# Patient Record
Sex: Female | Born: 1990 | Race: Black or African American | Hispanic: No | Marital: Single | Smoking: Current some day smoker
Health system: Southern US, Community
[De-identification: ages and names within clinical notes are randomized; demographics above are authoritative.]

## PROBLEM LIST (undated history)

## (undated) DIAGNOSIS — F1410 Cocaine abuse, uncomplicated: Secondary | ICD-10-CM

## (undated) DIAGNOSIS — F1220 Cannabis dependence, uncomplicated: Secondary | ICD-10-CM

## (undated) DIAGNOSIS — F23 Brief psychotic disorder: Secondary | ICD-10-CM

## (undated) DIAGNOSIS — F419 Anxiety disorder, unspecified: Secondary | ICD-10-CM

## (undated) DIAGNOSIS — F12250 Cannabis dependence with psychotic disorder with delusions: Secondary | ICD-10-CM

## (undated) HISTORY — PX: NO PAST SURGERIES: SHX2092

## (undated) HISTORY — DX: Brief psychotic disorder: F23

---
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
## 2011-08-21 ENCOUNTER — Inpatient Hospital Stay (HOSPITAL_COMMUNITY)
Admission: AD | Admit: 2011-08-21 | Discharge: 2011-08-21 | Disposition: A | Discharge status: Home / Self Care | Payer: Medicaid Other | Source: Ambulatory Visit | Attending: Obstetrics & Gynecology | Admitting: Obstetrics & Gynecology

## 2011-08-21 ENCOUNTER — Encounter (HOSPITAL_COMMUNITY): Payer: Self-pay | Admitting: *Deleted

## 2011-08-21 DIAGNOSIS — L293 Anogenital pruritus, unspecified: Secondary | ICD-10-CM | POA: Insufficient documentation

## 2011-08-21 DIAGNOSIS — L292 Pruritus vulvae: Secondary | ICD-10-CM

## 2011-08-21 DIAGNOSIS — R42 Dizziness and giddiness: Secondary | ICD-10-CM | POA: Insufficient documentation

## 2011-08-21 DIAGNOSIS — N912 Amenorrhea, unspecified: Secondary | ICD-10-CM

## 2011-08-21 DIAGNOSIS — N911 Secondary amenorrhea: Secondary | ICD-10-CM

## 2011-08-21 LAB — WET PREP, GENITAL
Clue Cells Wet Prep HPF POC: NONE SEEN
Yeast Wet Prep HPF POC: NONE SEEN

## 2011-08-21 MED ORDER — NYSTATIN-TRIAMCINOLONE 100000-0.1 UNIT/GM-% EX OINT: TOPICAL_OINTMENT | Freq: Two times a day (BID) | CUTANEOUS | Status: DC

## 2011-08-21 NOTE — MAU Note (Signed)
C/o feeling fainty and ? pregnancy

## 2011-08-21 NOTE — MAU Provider Note (Signed)
History     CSN: [id number]  Arrival date and time: 08/21/11 1478   First Provider Initiated Contact with Patient 08/21/11 1005      Chief Complaint  Patient presents with  . Possible Pregnancy   HPI Pt presents today after having felt dizzy and faint in the middle of the night on Saturday. She states she that she drank some water and felt better but did feel like her fallopian tubes were hurting. She then realized she had not had her period since sometime in June and thought she was getting it. Since she has not started in the last two days she thought she should get checked out. She also reports milky, white vaginal discharge and itching.    Past Medical History  Diagnosis Date  . No pertinent past medical history     Past Surgical History  Procedure Date  . No past surgeries     Family History  Problem Relation Age of Onset  . Other Neg Hx     History  Substance Use Topics  . Smoking status: Current Everyday Smoker -- 0.2 packs/day for 4 years    Types: Cigarettes  . Smokeless tobacco: Not on file  . Alcohol Use: Yes     occasional    Allergies: No Known Allergies  No prescriptions prior to admission    Review of Systems  Constitutional: Negative for fever and chills.  Gastrointestinal: Positive for diarrhea. Negative for nausea, vomiting and abdominal pain.  Genitourinary: Negative for dysuria, urgency, frequency and hematuria.   Physical Exam   Blood pressure 114/63, pulse 76, temperature 98.1 F (36.7 C), temperature source Oral, resp. rate 16, height 5\' 4"  (1.626 m), weight 70.308 kg (155 lb), last menstrual period 06/23/2011.  Physical Exam  Constitutional: She appears well-developed and well-nourished. No distress.  Cardiovascular: Normal rate, regular rhythm and normal heart sounds.   Respiratory: Effort normal and breath sounds normal.  GI: Soft. Bowel sounds are normal. She exhibits no distension and no mass. There is no tenderness. There is no  rebound and no guarding.  Pelvic: nefg, vagina no lesions, slight thick, white odorless discharge in the vaginal vault, no blood, cervix non friable, no cervical motion tenderness  Results for orders placed during the hospital encounter of 08/21/11 (from the past 24 hour(s))  POCT PREGNANCY, URINE     Status: Normal   Collection Time   08/21/11  9:51 AM      Component Value Range   Preg Test, Ur NEGATIVE  NEGATIVE  WET PREP, GENITAL     Status: Abnormal   Collection Time   08/21/11 11:20 AM      Component Value Range   Yeast Wet Prep HPF POC NONE SEEN  NONE SEEN   Trich, Wet Prep NONE SEEN  NONE SEEN   Clue Cells Wet Prep HPF POC NONE SEEN  NONE SEEN   WBC, Wet Prep HPF POC FEW (*) NONE SEEN  HCG, QUANTITATIVE, PREGNANCY     Status: Normal   Collection Time   08/21/11 11:30 AM      Component Value Range   hCG, Beta Chain, Quant, S <1  <5 mIU/mL   MAU Course  Procedures   Assessment and Plan  1. Missed period DDX include: pregnancy, etopic pregnancy, STI  Will do: Urine pregnancy, wet prep, C&S culture  Plan:  FORTH, VICTORIA 08/21/2011, 10:20 AM   Assessment: 1. Secondary amenorrhea w/ neg quant  2. Vulvar itching    Plan: D/C home  Medication List  As of 08/21/2011  4:29 PM   START taking these medications         nystatin-triamcinolone ointment   Commonly known as: MYCOLOG   Apply topically 2 (two) times daily.          Where to get your medications    These are the prescriptions that you need to pick up.   You may get these medications from any pharmacy.         nystatin-triamcinolone ointment           Follow-up Information    Follow up with Gynecologist. (As needed if symptoms worsen or if no period  in 1 month)         I was present for the exam and agree with above.  [place], [place] 08/21/2011 4:29 PM

## 2011-08-22 LAB — GC/CHLAMYDIA PROBE AMP, GENITAL: GC Probe Amp, Genital: NEGATIVE

## 2011-08-22 MED ORDER — NYSTATIN-TRIAMCINOLONE 100000-0.1 UNIT/GM-% EX OINT: TOPICAL_OINTMENT | Freq: Two times a day (BID) | CUTANEOUS | Status: AC

## 2013-01-02 ENCOUNTER — Other Ambulatory Visit: Payer: Self-pay | Admitting: Obstetrics & Gynecology

## 2013-01-02 ENCOUNTER — Other Ambulatory Visit (HOSPITAL_COMMUNITY)
Admission: RE | Admit: 2013-01-02 | Discharge: 2013-01-02 | Disposition: A | Discharge status: Home / Self Care | Payer: BC Managed Care – PPO | Source: Ambulatory Visit | Attending: Obstetrics & Gynecology | Admitting: Obstetrics & Gynecology

## 2013-01-02 DIAGNOSIS — Z01419 Encounter for gynecological examination (general) (routine) without abnormal findings: Secondary | ICD-10-CM | POA: Insufficient documentation

## 2013-01-02 DIAGNOSIS — N760 Acute vaginitis: Secondary | ICD-10-CM | POA: Insufficient documentation

## 2013-01-02 DIAGNOSIS — Z113 Encounter for screening for infections with a predominantly sexual mode of transmission: Secondary | ICD-10-CM | POA: Insufficient documentation

## 2013-11-17 ENCOUNTER — Encounter (HOSPITAL_COMMUNITY): Payer: Self-pay | Admitting: *Deleted

## 2015-02-17 DIAGNOSIS — F12250 Cannabis dependence with psychotic disorder with delusions: Secondary | ICD-10-CM

## 2015-02-17 HISTORY — DX: Cannabis dependence with psychotic disorder with delusions: F12.250

## 2015-03-08 ENCOUNTER — Encounter (HOSPITAL_COMMUNITY): Payer: Self-pay | Admitting: Emergency Medicine

## 2015-03-08 ENCOUNTER — Emergency Department (HOSPITAL_COMMUNITY): Payer: BLUE CROSS/BLUE SHIELD

## 2015-03-08 ENCOUNTER — Emergency Department (HOSPITAL_COMMUNITY)
Admission: EM | Admit: 2015-03-08 | Discharge: 2015-03-09 | Disposition: A | Discharge status: Other Acute Inpatient Hospital | Payer: BLUE CROSS/BLUE SHIELD | Attending: Emergency Medicine | Admitting: Emergency Medicine

## 2015-03-08 DIAGNOSIS — Z008 Encounter for other general examination: Secondary | ICD-10-CM | POA: Diagnosis present

## 2015-03-08 DIAGNOSIS — F17210 Nicotine dependence, cigarettes, uncomplicated: Secondary | ICD-10-CM | POA: Insufficient documentation

## 2015-03-08 DIAGNOSIS — F1210 Cannabis abuse, uncomplicated: Secondary | ICD-10-CM | POA: Diagnosis not present

## 2015-03-08 DIAGNOSIS — F12159 Cannabis abuse with psychotic disorder, unspecified: Secondary | ICD-10-CM | POA: Diagnosis not present

## 2015-03-08 DIAGNOSIS — F919 Conduct disorder, unspecified: Secondary | ICD-10-CM | POA: Diagnosis not present

## 2015-03-08 DIAGNOSIS — R4689 Other symptoms and signs involving appearance and behavior: Secondary | ICD-10-CM

## 2015-03-08 LAB — CBC
HEMATOCRIT: 30.8 % — AB (ref 36.0–46.0)
HEMOGLOBIN: 10.2 g/dL — AB (ref 12.0–15.0)
MCH: 29.4 pg (ref 26.0–34.0)
MCHC: 33.1 g/dL (ref 30.0–36.0)
MCV: 88.8 fL (ref 78.0–100.0)
PLATELETS: 249 10*3/uL (ref 150–400)
RBC: 3.47 MIL/uL — ABNORMAL LOW (ref 3.87–5.11)
RDW: 12.8 % (ref 11.5–15.5)
WBC: 5.3 10*3/uL (ref 4.0–10.5)

## 2015-03-08 LAB — COMPREHENSIVE METABOLIC PANEL
ALBUMIN: 4.9 g/dL (ref 3.5–5.0)
ALK PHOS: 49 U/L (ref 38–126)
ALT: 15 U/L (ref 14–54)
AST: 14 U/L — AB (ref 15–41)
Anion gap: 10 (ref 5–15)
BILIRUBIN TOTAL: 0.3 mg/dL (ref 0.3–1.2)
BUN: 13 mg/dL (ref 6–20)
CALCIUM: 9.1 mg/dL (ref 8.9–10.3)
CO2: 24 mmol/L (ref 22–32)
Chloride: 106 mmol/L (ref 101–111)
Creatinine, Ser: 0.88 mg/dL (ref 0.44–1.00)
GFR calc Af Amer: >60 mL/min (ref >60)
GFR calc non Af Amer: >60 mL/min (ref >60)
GLUCOSE: 111 mg/dL — AB (ref 65–99)
Potassium: 3.1 mmol/L — ABNORMAL LOW (ref 3.5–5.1)
Sodium: 140 mmol/L (ref 135–145)
TOTAL PROTEIN: 7.9 g/dL (ref 6.5–8.1)

## 2015-03-08 LAB — RAPID URINE DRUG SCREEN, HOSP PERFORMED
AMPHETAMINES: NOT DETECTED
BENZODIAZEPINES: NOT DETECTED
Barbiturates: NOT DETECTED
COCAINE: NOT DETECTED
Opiates: NOT DETECTED
Tetrahydrocannabinol: POSITIVE — AB

## 2015-03-08 LAB — SALICYLATE LEVEL: Salicylate Lvl: <4 mg/dL (ref 2.8–30.0)

## 2015-03-08 LAB — ETHANOL: Alcohol, Ethyl (B): <5 mg/dL (ref <5)

## 2015-03-08 LAB — ACETAMINOPHEN LEVEL: Acetaminophen (Tylenol), Serum: <10 ug/mL — ABNORMAL LOW (ref 10–30)

## 2015-03-08 MED ORDER — HALOPERIDOL LACTATE 5 MG/ML IJ SOLN
5.0000 mg | Freq: Once | INTRAMUSCULAR | Status: AC
  Administered 2015-03-08: 5 mg via INTRAMUSCULAR
  Filled 2015-03-08: qty 1

## 2015-03-08 MED ORDER — LORAZEPAM 1 MG PO TABS
1.0000 mg | ORAL_TABLET | Freq: Three times a day (TID) | ORAL | Status: DC | PRN
  Filled 2015-03-08: qty 1

## 2015-03-08 MED ORDER — DIPHENHYDRAMINE HCL 50 MG/ML IJ SOLN
50.0000 mg | Freq: Once | INTRAMUSCULAR | Status: AC
  Administered 2015-03-08: 50 mg via INTRAMUSCULAR
  Filled 2015-03-08: qty 1

## 2015-03-08 MED ORDER — NICOTINE 21 MG/24HR TD PT24: 21.0000 mg | MEDICATED_PATCH | Freq: Every day | TRANSDERMAL | Status: DC

## 2015-03-08 MED ORDER — ZOLPIDEM TARTRATE 5 MG PO TABS
5.0000 mg | ORAL_TABLET | Freq: Every evening | ORAL | Status: DC | PRN
  Filled 2015-03-08: qty 1

## 2015-03-08 MED ORDER — ONDANSETRON HCL 4 MG PO TABS
4.0000 mg | ORAL_TABLET | Freq: Three times a day (TID) | ORAL | Status: DC | PRN
Start: 2015-03-08 — End: 2015-03-09

## 2015-03-08 MED ORDER — POTASSIUM CHLORIDE CRYS ER 10 MEQ PO TBCR
10.0000 meq | EXTENDED_RELEASE_TABLET | Freq: Every day | ORAL | Status: DC
  Filled 2015-03-08: qty 1

## 2015-03-08 MED ORDER — POTASSIUM CHLORIDE CRYS ER 20 MEQ PO TBCR
40.0000 meq | EXTENDED_RELEASE_TABLET | Freq: Once | ORAL | Status: AC
  Administered 2015-03-08: 40 meq via ORAL
  Filled 2015-03-08: qty 2

## 2015-03-08 MED ORDER — ACETAMINOPHEN 325 MG PO TABS
650.0000 mg | ORAL_TABLET | ORAL | Status: DC | PRN
  Filled 2015-03-08: qty 2

## 2015-03-08 MED ORDER — IBUPROFEN 200 MG PO TABS: 600.0000 mg | ORAL_TABLET | Freq: Three times a day (TID) | ORAL | Status: DC | PRN

## 2015-03-08 MED ORDER — LORAZEPAM 2 MG/ML IJ SOLN
2.0000 mg | Freq: Once | INTRAMUSCULAR | Status: AC
  Administered 2015-03-08: 2 mg via INTRAMUSCULAR
  Filled 2015-03-08: qty 1

## 2015-03-08 MED ORDER — ALUM & MAG HYDROXIDE-SIMETH 200-200-20 MG/5ML PO SUSP: 30.0000 mL | ORAL | Status: DC | PRN

## 2015-03-08 NOTE — ED Notes (Signed)
Pt would not sign a ROI for [place] to discuss her care or disposition with her mother. She told her mother that it is time to go, so her mother left.
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
## 2015-03-08 NOTE — ED Notes (Signed)
Bed: [facility]37 Expected date:  Expected time:  Means of arrival:  Comments: Triage 9 
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
## 2015-03-08 NOTE — ED Notes (Signed)
Patient pacing, restless, appears suspicious. Lies down briefly, then back up.   Q 15 safety checks continue.

## 2015-03-08 NOTE — ED Notes (Addendum)
Per [employer] pt sent voluntarily per family request related to pt sitting in bedroom with knife because she believes mother is speaking with devil and trying to hurt her. Upon assessment pt states to staff "you are doing some evil and God is not happy with you." Pt denies SI/HI or A/VH.

## 2015-03-08 NOTE — BH Assessment (Signed)
[facility] Assessment Progress Note  The following facilities have been contacted to seek placement for this pt, with results as noted:  Beds available, information sent, decision pending:  [facility] [place] [facility]   At capacity:  [facility] [place]   Doylene Canning, [place] Triage Specialist [phone]

## 2015-03-08 NOTE — ED Notes (Signed)
Patient appears pleasant, cooperative. Mildly confused. Disoriented to situation. Childlike speech, behavior.  Patient appears confused and unsure when asked about SI, HI, AVH.   Oriented patient to place and situation. Encouragement offered.  Q 15 safety checks continue.

## 2015-03-08 NOTE — ED Notes (Signed)
Loudly rebuking satan and demons from patients and staff on the unit. Stating that she wants to leave.  Encouraged to return to her room and try to rest. Reminded patient that she is under IVC and must see psychiatry.  Q 15 safety checks continue.

## 2015-03-08 NOTE — ED Notes (Signed)
Restless, tearful, confused. Wants to leave.  Reoriented to situation. Encouragement offered. Environment adjusted. Medication offered, patient paranoid, declined.   Q 15 safety checks continue.

## 2015-03-08 NOTE — ED Notes (Signed)
Patient medicated. Observed to have bodysuite/bra on. Patient refuses to remove item(s).

## 2015-03-08 NOTE — ED Notes (Addendum)
PT REFUSE TO HAVE BLOOD COLLECTED

## 2015-03-08 NOTE — ED Notes (Signed)
Pt is A&O x3. She does not have insight as to why she is here. She admits to having thoughts of harming herself sometimes and is not sure if she wants to harm others. Her behavior is appropriate. She was oriented to the unit and given juice to drink. She did not want any other snack.

## 2015-03-09 ENCOUNTER — Encounter (HOSPITAL_COMMUNITY): Payer: Self-pay

## 2015-03-09 ENCOUNTER — Inpatient Hospital Stay (HOSPITAL_COMMUNITY)
Admission: AD | Admit: 2015-03-09 | Discharge: 2015-03-12 | DRG: 897 | Disposition: A | Discharge status: Home / Self Care | Payer: BLUE CROSS/BLUE SHIELD | Attending: Psychiatry | Admitting: Psychiatry

## 2015-03-09 DIAGNOSIS — F17210 Nicotine dependence, cigarettes, uncomplicated: Secondary | ICD-10-CM | POA: Diagnosis present

## 2015-03-09 DIAGNOSIS — F12159 Cannabis abuse with psychotic disorder, unspecified: Secondary | ICD-10-CM | POA: Diagnosis not present

## 2015-03-09 DIAGNOSIS — F1410 Cocaine abuse, uncomplicated: Secondary | ICD-10-CM | POA: Diagnosis present

## 2015-03-09 DIAGNOSIS — F12250 Cannabis dependence with psychotic disorder with delusions: Secondary | ICD-10-CM | POA: Diagnosis present

## 2015-03-09 DIAGNOSIS — F1220 Cannabis dependence, uncomplicated: Secondary | ICD-10-CM | POA: Clinically undetermined

## 2015-03-09 DIAGNOSIS — F419 Anxiety disorder, unspecified: Secondary | ICD-10-CM | POA: Diagnosis present

## 2015-03-09 DIAGNOSIS — F29 Unspecified psychosis not due to a substance or known physiological condition: Secondary | ICD-10-CM | POA: Diagnosis present

## 2015-03-09 DIAGNOSIS — F24 Shared psychotic disorder: Secondary | ICD-10-CM | POA: Diagnosis present

## 2015-03-09 MED ORDER — HALOPERIDOL 1 MG PO TABS
1.0000 mg | ORAL_TABLET | Freq: Two times a day (BID) | ORAL | Status: DC
  Administered 2015-03-09: 1 mg via ORAL
  Filled 2015-03-09 (×3): qty 1
  Filled 2015-03-09: qty 2
  Filled 2015-03-09: qty 1
  Filled 2015-03-09: qty 2

## 2015-03-09 MED ORDER — BENZTROPINE MESYLATE 1 MG PO TABS
0.5000 mg | ORAL_TABLET | Freq: Two times a day (BID) | ORAL | Status: DC
  Filled 2015-03-09: qty 1

## 2015-03-09 MED ORDER — BENZTROPINE MESYLATE 0.5 MG PO TABS
0.5000 mg | ORAL_TABLET | Freq: Two times a day (BID) | ORAL | Status: DC
  Administered 2015-03-09: 0.5 mg via ORAL
  Filled 2015-03-09 (×6): qty 1

## 2015-03-09 MED ORDER — TRAZODONE HCL 100 MG PO TABS: 100.0000 mg | ORAL_TABLET | Freq: Every evening | ORAL | Status: DC | PRN

## 2015-03-09 MED ORDER — MAGNESIUM HYDROXIDE 400 MG/5ML PO SUSP: 30.0000 mL | Freq: Every day | ORAL | Status: DC | PRN

## 2015-03-09 MED ORDER — ALUM & MAG HYDROXIDE-SIMETH 200-200-20 MG/5ML PO SUSP: 30.0000 mL | ORAL | Status: DC | PRN

## 2015-03-09 MED ORDER — ACETAMINOPHEN 325 MG PO TABS: 650.0000 mg | ORAL_TABLET | Freq: Four times a day (QID) | ORAL | Status: DC | PRN

## 2015-03-09 MED ORDER — ONDANSETRON HCL 4 MG PO TABS: 4.0000 mg | ORAL_TABLET | Freq: Three times a day (TID) | ORAL | Status: DC | PRN

## 2015-03-09 MED ORDER — NICOTINE 21 MG/24HR TD PT24
21.0000 mg | MEDICATED_PATCH | Freq: Every day | TRANSDERMAL | Status: DC
  Filled 2015-03-09 (×6): qty 1

## 2015-03-09 MED ORDER — POTASSIUM CHLORIDE CRYS ER 10 MEQ PO TBCR
10.0000 meq | EXTENDED_RELEASE_TABLET | Freq: Every day | ORAL | Status: DC
  Administered 2015-03-10: 10 meq via ORAL
  Filled 2015-03-09 (×3): qty 1

## 2015-03-09 MED ORDER — IBUPROFEN 600 MG PO TABS: 600.0000 mg | ORAL_TABLET | Freq: Three times a day (TID) | ORAL | Status: DC | PRN

## 2015-03-09 MED ORDER — ACETAMINOPHEN 325 MG PO TABS: 650.0000 mg | ORAL_TABLET | ORAL | Status: DC | PRN

## 2015-03-09 MED ORDER — HALOPERIDOL 1 MG PO TABS
1.0000 mg | ORAL_TABLET | Freq: Two times a day (BID) | ORAL | Status: DC
  Filled 2015-03-09: qty 1

## 2015-03-09 MED ORDER — TRAZODONE HCL 100 MG PO TABS
100.0000 mg | ORAL_TABLET | Freq: Every evening | ORAL | Status: DC | PRN
Start: 2015-03-09 — End: 2015-03-12
  Administered 2015-03-09 – 2015-03-11 (×2): 100 mg via ORAL
  Filled 2015-03-09 (×2): qty 1

## 2015-03-09 MED ORDER — LORAZEPAM 1 MG PO TABS: 1.0000 mg | ORAL_TABLET | Freq: Three times a day (TID) | ORAL | Status: DC | PRN

## 2015-03-09 NOTE — Plan of Care (Signed)
Problem: Alteration in mood & ability to function due to Goal: STG-Patient will attend groups Outcome: Progressing Pt attended evening wrap up group     

## 2015-03-09 NOTE — ED Notes (Signed)
Pt discharged ambulatory with [employer]  All belongings were returned to patient.

## 2015-03-09 NOTE — Consult Note (Signed)
[facility] Consult   Reason for Consult:Substance induced mood disorder, R/O Psychosis.   Referring Physician:  EDP Patient Identification: Brenda Hamilton MRN:  [id number] Principal Diagnosis: Cannabis abuse with psychotic disorder [facility]) Diagnosis:   Patient Active Problem List   Diagnosis Date Noted  . Cannabis abuse with psychotic disorder [facility]) [F12.159] 03/09/2015    Priority: High    Total Time spent with patient: 45 minutes  Subjective:   Brenda Hamilton is a 25 y.o. female patient admitted with  Substance induced mood disorder, R/O Psychosis.  HPI:  AA Female, 25 years old was evaluated this morning after she was brought in under IVC  For Psychotic symptoms.  Patient was found sitting in her bedroom  With a knife in her hand because she believed that her mother was speaking with the devils.  Patient, this morning remembers that she was crying and screaming because she thought her boyfriend is sleeping with her roommate.  She admits to daily use of Marijuana and states that she is always keeping a knife all the time her for protection.  She was agitated on arrival to the ER and was yelling at staff members.  Patient denies previous outpatient or inpatient hospitalization or diagnosis.  She denies taking any MH medications.  She denies SI/HI/AVH and she has been accepted for admission and we will be seeking placement at any facility with available bed.  Past Psychiatric History:  none  Risk to Self: Suicidal Ideation: No Suicidal Intent: No Is patient at risk for suicide?: No Suicidal Plan?: No Access to Means: No What has been your use of drugs/alcohol within the last 12 months?: see SA section Intentional Self Injurious Behavior: None Risk to Others: Homicidal Ideation: No Thoughts of Harm to Others: No (had knife today "defending herself") Current Homicidal Intent: No Current Homicidal Plan: No Access to Homicidal Means: No History of harm to others?:  No Assessment of Violence: On admission (uncooperative to come to the hospital) Violent Behavior Description:  (had knife, but did not get aggressive) Does patient have access to weapons?: Yes (Comment) (knife) Criminal Charges Pending?: No Does patient have a court date: No Prior Inpatient Therapy: Prior Inpatient Therapy: No Prior Outpatient Therapy: Prior Outpatient Therapy: No Does patient have an ACCT team?: No Does patient have Intensive In-House Services?  : No Does patient have [facility] services? : No Does patient have P4CC services?: No  Past Medical History:  Past Medical History  Diagnosis Date  . No pertinent past medical history     Past Surgical History  Procedure Laterality Date  . No past surgeries     Family History:  Family History  Problem Relation Age of Onset  . Other Neg Hx    Family Psychiatric  History:  Unknown, patient denies Social History:  History  Alcohol Use  . Yes    Comment: occasional     History  Drug Use  . Yes  . Special: Marijuana, MDMA (Ecstacy), Cocaine    Comment: Marijuana one week ago    Social History   Social History  . Marital Status: Single    Spouse Name: N/A  . Number of Children: N/A  . Years of Education: N/A   Social History Main Topics  . Smoking status: Current Every Day Smoker -- 0.25 packs/day for 4 years    Types: Cigarettes  . Smokeless tobacco: None  . Alcohol Use: Yes     Comment: occasional  . Drug Use: Yes  Special: Marijuana, MDMA (Ecstacy), Cocaine     Comment: Marijuana one week ago  . Sexual Activity: Yes    Birth Control/ Protection: Condom   Other Topics Concern  . None   Social History Narrative   Additional Social History:    Allergies:  No Known Allergies  Labs:  Results for orders placed or performed during the hospital encounter of 03/08/15 (from the past 48 hour(s))  Urine rapid drug screen (hosp performed) (Not at [facility])     Status: Abnormal   Collection Time: 03/08/15  12:42 PM  Result Value Ref Range   Opiates NONE DETECTED NONE DETECTED   Cocaine NONE DETECTED NONE DETECTED   Benzodiazepines NONE DETECTED NONE DETECTED   Amphetamines NONE DETECTED NONE DETECTED   Tetrahydrocannabinol POSITIVE (A) NONE DETECTED   Barbiturates NONE DETECTED NONE DETECTED    Comment:        DRUG SCREEN FOR MEDICAL PURPOSES ONLY.  IF CONFIRMATION IS NEEDED FOR ANY PURPOSE, NOTIFY LAB WITHIN 5 DAYS.        LOWEST DETECTABLE LIMITS FOR URINE DRUG SCREEN Drug Class       Cutoff (ng/mL) Amphetamine      1000 Barbiturate      200 Benzodiazepine   400 Tricyclics       867 Opiates          300 Cocaine          300 THC              50   Ethanol (ETOH)     Status: None   Collection Time: 03/08/15 12:50 PM  Result Value Ref Range   Alcohol, Ethyl (B) <5 <5 mg/dL    Comment:        LOWEST DETECTABLE LIMIT FOR SERUM ALCOHOL IS 5 mg/dL FOR MEDICAL PURPOSES ONLY   Salicylate level     Status: None   Collection Time: 03/08/15 12:50 PM  Result Value Ref Range   Salicylate Lvl <6.1 2.8 - 30.0 mg/dL  Acetaminophen level     Status: Abnormal   Collection Time: 03/08/15 12:50 PM  Result Value Ref Range   Acetaminophen (Tylenol), Serum <10 (L) 10 - 30 ug/mL    Comment:        THERAPEUTIC CONCENTRATIONS VARY SIGNIFICANTLY. A RANGE OF 10-30 ug/mL MAY BE AN EFFECTIVE CONCENTRATION FOR MANY PATIENTS. HOWEVER, SOME ARE BEST TREATED AT CONCENTRATIONS OUTSIDE THIS RANGE. ACETAMINOPHEN CONCENTRATIONS >150 ug/mL AT 4 HOURS AFTER INGESTION AND >50 ug/mL AT 12 HOURS AFTER INGESTION ARE OFTEN ASSOCIATED WITH TOXIC REACTIONS.   Comprehensive metabolic panel     Status: Abnormal   Collection Time: 03/08/15 12:53 PM  Result Value Ref Range   Sodium 140 135 - 145 mmol/L   Potassium 3.1 (L) 3.5 - 5.1 mmol/L   Chloride 106 101 - 111 mmol/L   CO2 24 22 - 32 mmol/L   Glucose, Bld 111 (H) 65 - 99 mg/dL   BUN 13 6 - 20 mg/dL   Creatinine, Ser 0.88 0.44 - 1.00 mg/dL   Calcium  9.1 8.9 - 10.3 mg/dL   Total Protein 7.9 6.5 - 8.1 g/dL   Albumin 4.9 3.5 - 5.0 g/dL   AST 14 (L) 15 - 41 U/L   ALT 15 14 - 54 U/L   Alkaline Phosphatase 49 38 - 126 U/L   Total Bilirubin 0.3 0.3 - 1.2 mg/dL   GFR calc non Af Amer >60 >60 mL/min   GFR calc Af Amer >60 >60  mL/min    Comment: (NOTE) The eGFR has been calculated using the CKD EPI equation. This calculation has not been validated in all clinical situations. eGFR's persistently <60 mL/min signify possible Chronic Kidney Disease.    Anion gap 10 5 - 15  CBC     Status: Abnormal   Collection Time: 03/08/15 12:53 PM  Result Value Ref Range   WBC 5.3 4.0 - 10.5 K/uL   RBC 3.47 (L) 3.87 - 5.11 MIL/uL   Hemoglobin 10.2 (L) 12.0 - 15.0 g/dL   HCT 30.8 (L) 36.0 - 46.0 %   MCV 88.8 78.0 - 100.0 fL   MCH 29.4 26.0 - 34.0 pg   MCHC 33.1 30.0 - 36.0 g/dL   RDW 12.8 11.5 - 15.5 %   Platelets 249 150 - 400 K/uL    Current Facility-Administered Medications  Medication Dose Route Frequency Provider Last Rate Last Dose  . acetaminophen (TYLENOL) tablet 650 mg  650 mg Oral Q4H PRN Shawn C Joy, PA-C      . alum & mag hydroxide-simeth (MAALOX/MYLANTA) 200-200-20 MG/5ML suspension 30 mL  30 mL Oral PRN Shawn C Joy, PA-C      . benztropine (COGENTIN) tablet 0.5 mg  0.5 mg Oral BID Shenee Wignall, MD   0.5 mg at 03/09/15 1136  . haloperidol (HALDOL) tablet 1 mg  1 mg Oral BID Corena Pilgrim, MD   1 mg at 03/09/15 1137  . ibuprofen (ADVIL,MOTRIN) tablet 600 mg  600 mg Oral Q8H PRN Shawn C Joy, PA-C      . LORazepam (ATIVAN) tablet 1 mg  1 mg Oral Q8H PRN Shawn C Joy, PA-C      . nicotine (NICODERM CQ - dosed in mg/24 hours) patch 21 mg  21 mg Transdermal Daily Shawn C Joy, PA-C   21 mg at 03/08/15 1556  . ondansetron (ZOFRAN) tablet 4 mg  4 mg Oral Q8H PRN Shawn C Joy, PA-C      . potassium chloride (K-DUR,KLOR-CON) CR tablet 10 mEq  10 mEq Oral Daily Patrecia Pour, NP   10 mEq at 03/09/15 1000  . traZODone (DESYREL) tablet 100 mg   100 mg Oral QHS PRN Corena Pilgrim, MD       No current outpatient prescriptions on file.    Musculoskeletal: Strength & Muscle Tone: within normal limits Gait & Station: normal Patient leans: N/A  Psychiatric Specialty Exam: Review of Systems  Constitutional: Negative.   HENT: Negative.   Eyes: Negative.   Respiratory: Negative.   Cardiovascular: Negative.   Gastrointestinal: Negative.   Genitourinary: Negative.   Musculoskeletal: Negative.   Skin: Negative.   Neurological: Negative.   Endo/Heme/Allergies: Negative.     Blood pressure 110/80, pulse 70, temperature 97.6 F (36.4 C), temperature source Oral, resp. rate 16, last menstrual period 03/08/2015, SpO2 96 %.There is no weight on file to calculate BMI.  General Appearance: Casual and Fairly Groomed  Engineer, water::  Good  Speech:  Normal Rate  Volume:  Normal  Mood:  Anxious  Affect:  Congruent  Thought Process:  Coherent, Goal Directed and Intact  Orientation:  Full (Time, Place, and Person)  Thought Content:  WDL  Suicidal Thoughts:  No  Homicidal Thoughts:  No  Memory:  Immediate;   Poor Recent;   Poor Remote;   Poor  Judgement:  Poor  Insight:  Lacking  Psychomotor Activity:  Normal  Concentration:  Good  Recall:  NA  Fund of Knowledge:Poor  Language: Fair  Akathisia:  NA  Handed:  Right  AIMS (if indicated):     Assets:  Desire for Improvement  ADL's:  Intact  Cognition: WNL  Sleep:      Treatment Plan Summary: Daily contact with patient to assess and evaluate symptoms and progress in treatment and Medication management  Disposition: Accepted for admission and we will be seeking placement at any facility with available beds.   Patient is placed on Haldol 1 mg po bid for mood control, Cogentin 0.5 mg po bid for EPS, and Trazodone  50 mg at bed time for sleep.  Delfin Gant, NP    PMHNP-BC 03/09/2015 12:10 PM Patient seen face-to-face for psychiatric evaluation, chart reviewed and case  discussed with the physician extender and developed treatment plan. Reviewed the information documented and agree with the treatment plan. Corena Pilgrim, MD

## 2015-03-09 NOTE — Tx Team (Signed)
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  The patient and family have been given the opportunity to ask questions and make suggestions.  Mickie Bail 03/09/2015, 6:27 PM

## 2015-03-09 NOTE — BH Assessment (Signed)
[facility] Assessment Progress Note  Per Thedore Mins, MD, this pt requires psychiatric hospitalization at this time. Pt is under IVC initiated by the EDP. Berneice Heinrich, RN, [facility] has assigned pt to [facility] Rm 506-2. IVC documents have been faxed to [facility]. Pt's nurse, Kendal Hymen, has been notified, and agrees to call report to [phone]. Pt is to be transported via Patent examiner.  Doylene Canning, MA Triage Specialist [phone]

## 2015-03-09 NOTE — ED Notes (Signed)
Pt is refusing all meds.  She thinks the medicine is poison to her.  She denies SI, HI, and AVH.    15 minute checks and video monitoring continue.

## 2015-03-09 NOTE — Progress Notes (Signed)
Admission Note: Inis was admitted after her family became worried about her bizarre behavior: not sleeping, eating, hyperreligiosity, and paranoia. Kyley denies that she needs to be here. "I feel like my life is always controlled" by her mom. She reports hearing voices the day she came to the ED but denies them now. "It sounds like where on TV someone's watching you. They ad-lib, they laugh." She reports smoking a few cigarettes a day and rarely drinking wine. She declined the patch. "I honestly don't think I have a problem." Skin assessment was unremarkable. Pt was oriented to unit and report given to primary RN.

## 2015-03-09 NOTE — Progress Notes (Signed)
Adult Psychoeducational Group Note  Date:  03/09/2015 Time:  9:35 PM  Group Topic/Focus:  Wrap-Up Group:   The focus of this group is to help patients review their daily goal of treatment and discuss progress on daily workbooks.  Participation Level:  Active  Participation Quality:  Appropriate  Affect:  Appropriate  Cognitive:  Appropriate  Insight: Appropriate  Engagement in Group:  Engaged  Modes of Intervention:  Discussion  Additional Comments:  The patient just arrive and attended wrap up group  Octavio Manns 03/09/2015, 9:35 PM

## 2015-03-09 NOTE — Progress Notes (Signed)
Patient ID: Brenda Hamilton, female   DOB: 1990-09-09, 25 y.o.   MRN: [id number] D: Patient apprehensive about been here, asked if there are "crazy people here". Pt asked multiple questions about her admission. Pt isolative most of the evening except to get her medication and talk to Probation officer. Pt denies SI/HI/AVH and pain. Pt does not appear to be responding to internal stimuli. Pt attended and participated in evening wrap up group. Cooperative with assessment.   A: Met with pt 1:1. Medications administered as prescribed. Support and encouragement provided to attend groups and engage in milieu. Pt encouraged to discuss feelings and come to staff with any question or concerns.   R: Patient remains safe and complaint with medications.

## 2015-03-10 ENCOUNTER — Encounter (HOSPITAL_COMMUNITY): Payer: Self-pay | Admitting: Psychiatry

## 2015-03-10 DIAGNOSIS — F1410 Cocaine abuse, uncomplicated: Secondary | ICD-10-CM | POA: Clinically undetermined

## 2015-03-10 DIAGNOSIS — F1220 Cannabis dependence, uncomplicated: Secondary | ICD-10-CM | POA: Clinically undetermined

## 2015-03-10 LAB — PREGNANCY, URINE: Preg Test, Ur: NEGATIVE

## 2015-03-10 MED ORDER — BENZTROPINE MESYLATE 0.5 MG PO TABS
0.5000 mg | ORAL_TABLET | Freq: Every day | ORAL | Status: DC
  Administered 2015-03-10 – 2015-03-11 (×2): 0.5 mg via ORAL
  Filled 2015-03-10 (×3): qty 1
  Filled 2015-03-10: qty 3

## 2015-03-10 MED ORDER — SERTRALINE HCL 25 MG PO TABS
25.0000 mg | ORAL_TABLET | Freq: Every day | ORAL | Status: DC
  Administered 2015-03-10 – 2015-03-12 (×3): 25 mg via ORAL
  Filled 2015-03-10 (×2): qty 1
  Filled 2015-03-10: qty 3
  Filled 2015-03-10 (×3): qty 1

## 2015-03-10 MED ORDER — ENSURE ENLIVE PO LIQD
237.0000 mL | Freq: Two times a day (BID) | ORAL | Status: DC
  Administered 2015-03-10 – 2015-03-12 (×3): 237 mL via ORAL

## 2015-03-10 MED ORDER — ARIPIPRAZOLE 2 MG PO TABS
2.0000 mg | ORAL_TABLET | Freq: Every day | ORAL | Status: DC
Start: 2015-03-10 — End: 2015-03-12
  Administered 2015-03-10 – 2015-03-11 (×2): 2 mg via ORAL
  Filled 2015-03-10: qty 3
  Filled 2015-03-10 (×3): qty 1

## 2015-03-10 MED ORDER — POTASSIUM CHLORIDE CRYS ER 20 MEQ PO TBCR
20.0000 meq | EXTENDED_RELEASE_TABLET | Freq: Two times a day (BID) | ORAL | Status: AC
  Administered 2015-03-10 – 2015-03-12 (×4): 20 meq via ORAL
  Filled 2015-03-10 (×5): qty 1

## 2015-03-10 NOTE — Tx Team (Signed)
Interdisciplinary Treatment Plan Update (Adult)  Date:  03/10/2015 Time Reviewed:  11:59 AM  Progress in Treatment: Attending groups: Yes. Participating in groups: Yes. Taking medication as prescribed:  Yes. Tolerating medication:  Yes. Family/Significant othe contact made:  Yes, individual(s) contacted:  [name] [phone] Patient understands diagnosis:  Yes "iIneed to not use cocaine." Discussing patient identified problems/goals with staff:  Yes, see initial care plan. Medical problems stabilized or resolved:  Yes Denies suicidal/homicidal ideation: Yes. Issues/concerns per patient self-inventory: No. Other:  New problem(s) identified:   Discharge Plan or Barriers: See below  Reason for Continuation of Hospitalization: Hallucinations Medication stabilization  Comments: Brenda Hamilton is a 25 y.o. AA female, who is single , employed , lives with her mother in [place] , who has a hx of depression, ?anxiety unspecified , and substance abuse , who presented accompanied by her mom, sister and brother to [facility] ,who reported symptoms of bizarre behavior, laughing and crying all night, not sleeping for a week, not eating, hyper religiosity, paranoia about others thinking bad thoughts about her. Abilify, Congentin, Zoloft trial   Estimated length of stay: 2-4 days  New goal(s):  Review of initial/current patient goals per problem list:  1. Goal(s): Patient will participate in aftercare plan  Met: Yes   Target date: at discharge  As evidenced by: Patient will participate within aftercare plan AEB aftercare provider and housing plan at discharge being identified. 03/10/15: Pt will return home an follow-up outpt with [facility].   2. Goal (s): Patient will exhibit decreased depressive symptoms and suicidal ideations.   Met: No   Target date: 3-5 days post admission date   As evidenced by: Patient will utilize self rating of depression at 3 or below and demonstrate  decreased signs of depression or be deemed stable for discharge by MD. 03/10/15:  Pt denies SI, rates depression a 5 . 4. Goal(s): Patient will demonstrate decreased signs of psychosis.  Met: No  Target date:at discharge  As evidenced by: Patient will demonstrate decreased signs of psychosis as evidenced by a reduction in AVH, paranoia, and/or delusions.   03/10/15: Pt appears to be responding to internal stimuli as evidenced by inappropriate laughter, thought blocking, and disorganized thinking.    Attendees: Patient:  03/10/2015 11:59 AM  Family:   03/10/2015 11:59 AM  Physician:  Dr. Ursula Alert, MD 03/10/2015 11:59 AM  Nursing: Hedy Jacob, RN 03/10/2015 11:59 AM  Case Manager:  Roque Lias, LCSW 03/10/2015 11:59 AM  Counselor:  Matthew Saras, MSW Intern 03/10/2015 11:59 AM  Other:   03/10/2015 11:59 AM  Other:   03/10/2015 11:59 AM  Other:   03/10/2015 11:59 AM  Other:  03/10/2015 11:59 AM  Other:    Other:    Other:    Other:    Other:    Other:      Scribe for Treatment Team:   Georga Kaufmann, MSW Intern 03/10/2015 11:59 AM

## 2015-03-10 NOTE — Progress Notes (Signed)
Adult Psychoeducational Group Note  Date:  03/10/2015 Time:  10:34 PM  Group Topic/Focus:  Wellness Toolbox:   The focus of this group is to discuss various aspects of wellness, balancing those aspects and exploring ways to increase the ability to experience wellness.  Patients will create a wellness toolbox for use upon discharge. Wrap-Up Group:   The focus of this group is to help patients review their daily goal of treatment and discuss progress on daily workbooks.  Participation Level:  Minimal  Participation Quality:  Appropriate  Affect:  Appropriate  Cognitive:  Appropriate  Insight: Limited  Engagement in Group:  Limited  Modes of Intervention:  Socialization and Support  Additional Comments:  Patient attended and participated in group tonight. She reports that the best part of her day was communication with peers.  Lita Mains [facility] 03/10/2015, 10:34 PM
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
## 2015-03-10 NOTE — [facility] Group Notes (Signed)
[facility] Therapy  03/10/2015 , 1:59 PM    Type of Therapy:  [facility] Presentation  Participation Level:  Active  Participation Quality:  Attentive  Affect:  Blunted  Cognitive:  Oriented  Insight:  Limited  Engagement in Therapy:  Engaged  Modes of Intervention:  Discussion, Education and Socialization  Summary of Progress/Problems:  Onalee Hua from [facility] came to present his recovery story and play the guitar.  In bed asleep.  Daryel Gerald B 03/10/2015 , 1:59 PM

## 2015-03-10 NOTE — [facility] Suicide Risk Assessment (Signed)
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 Cocaine use disorder, mild, abuse [F14.10] 03/10/2015  . Anxiety disorder [F41.9] 03/10/2015   Subjective Data: Please see H&P.   Continued Clinical Symptoms:  Alcohol Use Disorder Identification Test Final Score (AUDIT): 1 The "Alcohol Use Disorders Identification Test", Guidelines for Use in Primary Care, Second Edition.  World Science writer [facility]). Score between 0-7:  no or low risk or alcohol related problems. Score between 8-15:  moderate risk of alcohol related problems. Score between 16-19:  high risk of alcohol related problems. Score 20 or above:  warrants further diagnostic evaluation for alcohol dependence and treatment.   CLINICAL FACTORS:   Severe Anxiety and/or Agitation Alcohol/Substance Abuse/Dependencies Previous Psychiatric Diagnoses and Treatments   Musculoskeletal: Strength & Muscle Tone: within normal limits Gait & Station: normal Patient leans: N/A  Psychiatric Specialty Exam: Review of Systems  Psychiatric/Behavioral: Positive for substance abuse. The patient is nervous/anxious and has insomnia.   All other systems reviewed and are negative.   Blood pressure 128/81, pulse 92, temperature 98.7 F (37.1 C), temperature source Oral, resp. rate 20, height 5' 4.57" (1.64 m), weight 69.945 kg (154 lb 3.2 oz), last menstrual period 03/08/2015, SpO2 100 %.Body mass index is 26.01 kg/(m^2).  Please see H&P.                                                       COGNITIVE FEATURES THAT CONTRIBUTE TO RISK:  Closed-mindedness, Polarized thinking and Thought constriction (tunnel vision)    SUICIDE RISK:   Mild:  Suicidal ideation of limited frequency, intensity, duration, and specificity.  There are no identifiable plans, no associated intent, mild dysphoria and related symptoms, good self-control (both objective and subjective assessment), few other risk factors, and identifiable protective factors, including available and accessible social support.  PLAN OF CARE: Please see H&P.   I certify that inpatient services furnished can reasonably be expected to improve the patient's condition.   Zachari Alberta, MD 03/10/2015, 9:57 AM
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
## 2015-03-10 NOTE — Progress Notes (Signed)
Brenda Hamilton has been visible on the unit.  She has been attending groups.  Minimal interaction with peers.  She denies SI/HI or A/V hallucinations.  She wouldn't take her haldol this morning and stated that she has never taken that before.  Informed her that she can talk with the doctor about her medications.  After medication changes, she agreed to take her Zoloft.  She denies any physical complaints and appears to be in no physical distress.  She completed her self inventory and reports her depression, hopelessness and anxiety are 0/10.  Her goal for today "finding out when I leave here" and she will accomplish this goal by "talking to whomever I need to - who has that knowledge."  Encouraged continued participation in group and unit activities.  Q 15 minute checks maintained for safety.  We will continue to monitor the progress towards her goals.

## 2015-03-10 NOTE — B[facility] Suicide Risk Assessment (Signed)
B[facility] INPATIENT:  Family/Significant Other Suicide Prevention Education  Suicide Prevention Education:  Education Completed; Brenda Hamilton (mom) [phone], has been identified by the patient as the family member/significant other with whom the patient will be residing, and identified as the person(s) who will aid the patient in the event of a mental health crisis (suicidal ideations/suicide attempt).  With written consent from the patient, the family member/significant other has been provided the following suicide prevention education, prior to the and/or following the discharge of the patient.  The suicide prevention education provided includes the following:  Suicide risk factors  Suicide prevention and interventions  National Suicide Hotline telephone number  [facility] assessment telephone number  [facility] Emergency Assistance 911  [facility] and/or Residential Mobile Crisis Unit telephone number  Request made of family/significant other to:  Remove weapons (e.g., guns, rifles, knives), all items previously/currently identified as safety concern.    Remove drugs/medications (over-the-counter, prescriptions, illicit drugs), all items previously/currently identified as a safety concern.  The family member/significant other verbalizes understanding of the suicide prevention education information provided.  The family member/significant other agrees to remove the items of safety concern listed above.  Mom is concerned that pt refuses to believe there is anything wrong with her. Would like pt to receive therapy upon discharge. CSW suggested [facility] and mom was agreeable.   Brenda Hamilton 03/10/2015, 2:19 PM

## 2015-03-10 NOTE — Progress Notes (Signed)
D: Client  has visit from mom and brother today, client engaging in conversation and smiling. Client also seen in dayroom interacting with peers. Client reports she was committed by mom because she says "I wasn't myself, up all night, crying, then laughing, but I didn't realize it" client reports she smokes marijuana, "last night I did some cocaine and went off on the deep end" , A: Writer provided emotional support encouraged client to avoid all drugs, as they are mind altering and could cause mental and physical harm. Staff will monitor q47min for safety. R: Client is safe on the unit, attended group.

## 2015-03-10 NOTE — B[facility] Group Notes (Signed)
[facility] LCSW Aftercare Discharge Planning Group Note   03/10/2015 10:05 AM  Participation Quality:  Invited. Chose not to attend.   Brenda Hamilton

## 2015-03-11 DIAGNOSIS — F419 Anxiety disorder, unspecified: Secondary | ICD-10-CM

## 2015-03-11 DIAGNOSIS — F1410 Cocaine abuse, uncomplicated: Secondary | ICD-10-CM

## 2015-03-11 DIAGNOSIS — F12250 Cannabis dependence with psychotic disorder with delusions: Principal | ICD-10-CM

## 2015-03-11 LAB — LIPID PANEL
Cholesterol: 157 mg/dL (ref 0–200)
HDL: 62 mg/dL (ref >40)
LDL Cholesterol: 82 mg/dL (ref 0–99)
Total CHOL/HDL Ratio: 2.5 RATIO
Triglycerides: 63 mg/dL (ref <150)
VLDL: 13 mg/dL (ref 0–40)

## 2015-03-11 LAB — COMPREHENSIVE METABOLIC PANEL
ALK PHOS: 67 U/L (ref 38–126)
ALT: 17 U/L (ref 14–54)
AST: 23 U/L (ref 15–41)
Albumin: 4.9 g/dL (ref 3.5–5.0)
Anion gap: 8 (ref 5–15)
BUN: 12 mg/dL (ref 6–20)
CHLORIDE: 108 mmol/L (ref 101–111)
CO2: 26 mmol/L (ref 22–32)
CREATININE: 0.91 mg/dL (ref 0.44–1.00)
Calcium: 9.8 mg/dL (ref 8.9–10.3)
GFR calc non Af Amer: >60 mL/min (ref >60)
GLUCOSE: 68 mg/dL (ref 65–99)
Potassium: 4 mmol/L (ref 3.5–5.1)
SODIUM: 142 mmol/L (ref 135–145)
Total Bilirubin: 0.2 mg/dL — ABNORMAL LOW (ref 0.3–1.2)
Total Protein: 7.9 g/dL (ref 6.5–8.1)

## 2015-03-11 LAB — RAPID HIV SCREEN (HIV 1/2 AB+AG)
HIV 1/2 Antibodies: NONREACTIVE
HIV-1 P24 Antigen - HIV24: NONREACTIVE

## 2015-03-11 LAB — TSH: TSH: 0.837 u[IU]/mL (ref 0.350–4.500)

## 2015-03-11 NOTE — Progress Notes (Signed)
Adult Psychoeducational Group Note  Date:  03/11/2015 Time:  10:03 PM  Group Topic/Focus:  Wrap-Up Group:   The focus of this group is to help patients review their daily goal of treatment and discuss progress on daily workbooks.  Participation Level:  Active  Participation Quality:  Appropriate and Attentive  Affect:  Appropriate  Cognitive:  Appropriate  Insight: Appropriate and Good  Engagement in Group:  Engaged  Modes of Intervention:  Discussion  Additional Comments:  Pt rated her day a 5 out or 10. Pt goal is to make sure she discharge with an understanding on why she was here and the things she need to do to prevent from coming back.   Merlinda Frederick 03/11/2015, 10:03 PM

## 2015-03-11 NOTE — Plan of Care (Signed)
Problem: Alteration in mood & ability to function due to Goal: STG-Patient will comply with prescribed medication regimen (Patient will comply with prescribed medication regimen)  Outcome: Progressing Client is compliant with medication regime AEB taking medications without incidence.

## 2015-03-11 NOTE — Progress Notes (Signed)
D) Pt has been pleasant and cooperative this shift. Positive for groups with minimal prompting. Pt stated that "I don't have any voices anymore", then said "well sometimes". Denies they are command in nature. Denies s.i. Pt expressed concern regarding her STD testing. A) Level 3 obs for safety, support and encouragement provided. Urine collected. A) Level 3 obs for safety, support and encouragement provided. Med ed reinforced. R) Cooperative.

## 2015-03-11 NOTE — B[facility] Group Notes (Signed)
B[facility] Group Notes:  (Counselor/Nursing/MHT/Case Management/Adjunct)  03/11/2015 1:15PM  Type of Therapy:  Group Therapy  Participation Level:  Active  Participation Quality:  Appropriate  Affect:  Flat  Cognitive:  Oriented  Insight:  Improving  Engagement in Group:  Limited  Engagement in Therapy:  Limited  Modes of Intervention:  Discussion, Exploration and Socialization  Summary of Progress/Problems: The topic for group was balance in life.  Pt participated in the discussion about when their life was in balance and out of balance and how this feels.  Pt discussed ways to get back in balance and short term goals they can work on to get where they want to be. "Symmetry is important in balance.  Graciousness [place] oart if being balnced, because when you are gracious, you do not hurt others around you."  States she will feel more balanced when she completes her degree.  Got positive feedback re: her ability to complete shool, and stated she appreciated others' faith in her.  "My motivation to do well is paying off student loans."   Brenda Hamilton 03/11/2015 1:38 PM

## 2015-03-11 NOTE — Progress Notes (Addendum)
[facility] MD Progress Note  03/11/2015 11:33 AM Brenda Hamilton Subjective:  Patient states " I am better. I want some STD testing.'  Objective; Brenda Hamilton is a 25 y.o. AA female, who is single , employed , lives with her mother in [place] , who has a hx of depression, ?anxiety unspecified , and substance abuse , who presented accompanied by her mom, sister and brother to [facility] ,who reported symptoms of bizarre behavior, laughing and crying all night, not sleeping for a week, not eating, hyper religiosity, paranoia about others thinking bad thoughts about her.  Patient seen and chart reviewed.Discussed patient with treatment team.  Pt today is seen as less anxious , less paranoid , is tolerating her medications well.  Pt denies SI/HI /AH. Pt would like to get STD testing done , provided reassurance - will order test. Per staff - no disruptive issues noted on the unit.     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 Cocaine use disorder, mild, abuse [F14.10] 03/10/2015  . Anxiety disorder [F41.9] 03/10/2015  . Cannabis use disorder, severe, dependence (HCC) [F12.20] 03/10/2015   Total Time spent with patient: 25 minutes  Past Psychiatric History: Does have hx of depression , but was never treated. Denies past suicide attempts.Denies past hospitalziations  Past Medical History:  Past Medical History  Diagnosis Date  . No pertinent past medical history     Past Surgical History  Procedure Laterality Date  . No past surgeries     Family History:  Family History  Problem Relation Age of Onset  . Other Neg Hx   . Diabetes Mother    Family Psychiatric  History: Denies hx of mental illness, suicide ,substance abuse in  family. Social History: Lives with mother , brother, sister in [place] , is single , used to go to [employer] college doing MBA , currently Physiological scientist at [employer], denies legal issues, denies having children History  Alcohol Use  . 0.0 oz/week  . 0 Glasses of wine per week    Comment: occasional (once a month or less)     History  Drug Use  . Yes  . Special: Marijuana, MDMA (Ecstacy), Cocaine    Comment: Marijuana one week ago    Social History   Social History  . Marital Status: Single    Spouse Name: N/A  . Number of Children: N/A  . Years of Education: N/A   Social History Main Topics  . Smoking status: Current Some Day Smoker -- 0.25 packs/day for 4 years    Types: Cigarettes  . Smokeless tobacco: Never Used  . Alcohol Use: 0.0 oz/week    0 Glasses of wine per week     Comment: occasional (once a month or less)  . Drug Use: Yes    Special: Marijuana, MDMA (Ecstacy), Cocaine     Comment: Marijuana one week ago  . Sexual Activity: Yes    Birth Control/ Protection: Condom   Other Topics Concern  . None   Social History Narrative   Additional Social History:                         Sleep: Fair  Appetite:  Fair  Current Medications: Current Facility-Administered Medications  Medication  Dose Route Frequency Provider Last Rate Last Dose  . acetaminophen (TYLENOL) tablet 650 mg  650 mg Oral Q4H PRN Earney Navy, NP      . alum & mag hydroxide-simeth (MAALOX/MYLANTA) 200-200-20 MG/5ML suspension 30 mL  30 mL Oral Q4H PRN Earney Navy, NP      . ARIPiprazole (ABILIFY) tablet 2 mg  2 mg Oral QHS Jomarie Longs, MD   2 mg at 03/10/15 2230  . benztropine (COGENTIN) tablet 0.5 mg  0.5 mg Oral QHS Carlesha Seiple, MD   0.5 mg at 03/10/15 2230  . feeding supplement (ENSURE ENLIVE) (ENSURE ENLIVE) liquid 237 mL  237 mL Oral BID BM Noris Kulinski, MD   237 mL at 03/10/15 1433  . ibuprofen (ADVIL,MOTRIN) tablet 600 mg  600 mg Oral Q8H PRN Earney Navy, NP      .  LORazepam (ATIVAN) tablet 1 mg  1 mg Oral Q8H PRN Earney Navy, NP      . magnesium hydroxide (MILK OF MAGNESIA) suspension 30 mL  30 mL Oral Daily PRN Earney Navy, NP      . nicotine (NICODERM CQ - dosed in mg/24 hours) patch 21 mg  21 mg Transdermal Daily Earney Navy, NP   21 mg at 03/10/15 0827  . ondansetron (ZOFRAN) tablet 4 mg  4 mg Oral Q8H PRN Earney Navy, NP      . potassium chloride SA (K-DUR,KLOR-CON) CR tablet 20 mEq  20 mEq Oral BID Jomarie Longs, MD   20 mEq at 03/11/15 0757  . sertraline (ZOLOFT) tablet 25 mg  25 mg Oral Daily Jomarie Longs, MD   25 mg at 03/11/15 0757  . traZODone (DESYREL) tablet 100 mg  100 mg Oral QHS PRN Earney Navy, NP   100 mg at 03/09/15 2117    Lab Results:  Results for orders placed or performed during the hospital encounter of 03/09/15 (from the past 48 hour(s))  Pregnancy, urine     Status: None   Collection Time: 03/10/15  9:48 AM  Result Value Ref Range   Preg Test, Ur NEGATIVE NEGATIVE    Comment:        THE SENSITIVITY OF THIS METHODOLOGY IS >20 mIU/mL. Performed at [facility]   TSH     Status: None   Collection Time: 03/11/15  6:24 AM  Result Value Ref Range   TSH 0.837 0.350 - 4.500 uIU/mL    Comment: Performed at [facility]  Lipid panel     Status: None   Collection Time: 03/11/15  6:24 AM  Result Value Ref Range   Cholesterol 157 0 - 200 mg/dL   Triglycerides 63 <161 mg/dL   HDL 62 >09 mg/dL   Total CHOL/HDL Ratio 2.5 RATIO   VLDL 13 0 - 40 mg/dL   LDL Cholesterol 82 0 - 99 mg/dL    Comment:        Total Cholesterol/HDL:CHD Risk Coronary Heart Disease Risk Table                     Men   Women  1/2 Average Risk   3.4   3.3  Average Risk       5.0   4.4  2 X Average Risk   9.6   7.1  3 X Average Risk  23.4   11.0        Use the calculated Patient Ratio above and the CHD  Risk Table to determine the patient's CHD Risk.        ATP III  CLASSIFICATION (LDL):  <100     mg/dL   Optimal  161-096  mg/dL   Near or Above                    Optimal  130-159  mg/dL   Borderline  045-409  mg/dL   High  >811     mg/dL   Very High Performed at [facility]     Blood Alcohol level:  Lab Results  Component Value Date   [facility] <5 03/08/2015    Physical Findings: AIMS: Facial and Oral Movements Muscles of Facial Expression: None, normal Lips and Perioral Area: None, normal Jaw: None, normal Tongue: None, normal,Extremity Movements Upper (arms, wrists, hands, fingers): None, normal Lower (legs, knees, ankles, toes): None, normal, Trunk Movements Neck, shoulders, hips: None, normal, Overall Severity Severity of abnormal movements (highest score from questions above): None, normal Incapacitation due to abnormal movements: None, normal Patient's awareness of abnormal movements (rate only patient's report): No Awareness, Dental Status Current problems with teeth and/or dentures?: No Does patient usually wear dentures?: No  CIWA:    COWS:     Musculoskeletal: Strength & Muscle Tone: within normal limits Gait & Station: normal Patient leans: N/A  Psychiatric Specialty Exam: Review of Systems  Psychiatric/Behavioral: Positive for substance abuse. The patient is nervous/anxious.   All other systems reviewed and are negative.   Blood pressure 130/81, pulse 81, temperature 98.4 F (36.9 C), temperature source Oral, resp. rate 18, height 5' 4.57" (1.64 m), weight 69.945 kg (154 lb 3.2 oz), last menstrual period 03/08/2015, SpO2 100 %.Body mass index is 26.01 kg/(m^2).  General Appearance: Casual  Eye Contact::  Fair  Speech:  Clear and Coherent  Volume:  Normal  Mood:  Anxious  Affect:  Congruent  Thought Process:  Goal Directed  Orientation:  Full (Time, Place, and Person)  Thought Content:  Rumination  Suicidal Thoughts:  No  Homicidal Thoughts:  No  Memory:  Immediate;   Fair Recent;   Fair Remote;   Fair   Judgement:  Fair  Insight:  Fair  Psychomotor Activity:  Normal  Concentration:  Fair  Recall:  [employer] of Knowledge:Fair  Language: Fair  Akathisia:  No  Handed:  Right  AIMS (if indicated):     Assets:  Desire for Improvement Physical Health Social Support Talents/Skills Transportation Vocational/Educational  ADL's:  Intact  Cognition: WNL  Sleep:  Number of Hours: 6   Treatment Plan Summary::Brenda Hamilton is a 25 y.o. AA female, who is single , employed , lives with her mother in [place] , who has a hx of depression, ?anxiety unspecified , and substance abuse , who presented accompanied by her mom, sister and brother to [facility] ,who reported symptoms of bizarre behavior, laughing and crying all night, not sleeping for a week, not eating, hyper religiosity, paranoia about others thinking bad thoughts about her. Pt likely with substance induced delusions/anxiety sx- will continue treatment/observe on the unit. Daily contact with patient to assess and evaluate symptoms and progress in treatment and Medication management   Continue Zoloft 25 mg po daily for affective sx. Will continue Abilify 2 mg po qhs for psychosis/augment the Zoloft. Will continue Cogentin 0.5 mg po qhs for EPS. Will make available PRN medications as per agitation protocol. Will continue to monitor vitals ,medication compliance and treatment side effects while patient is here.  Will monitor for medical issues as well as call consult as needed.  Reviewed labs cbc - hb/hct low , cmp - K+ low -will replace with KDUR and repeat CMP, UDS - shows THC pos , BAL<5, ,urine pregnancy test- negative , lipid panel- pending , hba1c- pending , tsh- wnl  ,EKG for qtc- wnl. Will also order HIV/RPR,GC/CHL. CT scan head- no acute abnormality ( 03/08/15) CSW will start working on disposition.  Patient to participate in therapeutic milieu .    Brenda Mischke, MD 03/11/2015, 11:33 AM

## 2015-03-12 LAB — GC/CHLAMYDIA PROBE AMP (~~LOC~~) NOT AT ARMC
CHLAMYDIA, DNA PROBE: NEGATIVE
Neisseria Gonorrhea: NEGATIVE

## 2015-03-12 LAB — RPR: RPR Ser Ql: NONREACTIVE

## 2015-03-12 LAB — HEMOGLOBIN A1C
Hgb A1c MFr Bld: 5.5 % (ref 4.8–5.6)
MEAN PLASMA GLUCOSE: 111 mg/dL

## 2015-03-12 MED ORDER — BENZTROPINE MESYLATE 0.5 MG PO TABS: 0.5000 mg | ORAL_TABLET | Freq: Every day | ORAL | Status: DC

## 2015-03-12 MED ORDER — SERTRALINE HCL 25 MG PO TABS: 25.0000 mg | ORAL_TABLET | Freq: Every day | ORAL | Status: DC

## 2015-03-12 MED ORDER — ARIPIPRAZOLE 2 MG PO TABS: 2.0000 mg | ORAL_TABLET | Freq: Every day | ORAL | Status: DC

## 2015-03-12 NOTE — Progress Notes (Signed)
Brenda Hamilton has been visible on the unit.  Attending groups.  Interacting with peers.  She denies SI/HI or A/V hallucinations.  She is sleeping well.  No c/o of pain or discomfort and appears to be in no physical distress.  She completed her self inventory and reports that her depression and anxiety are 2/10 and her hopelessness is 0/10.  She states that her goal for today is to go home and she will accomplish this goal by "talk to the dr."  Pt. D/C from the unit to lobby accompanied by sister.  D/C instructions and medications reviewed with pt.  Pt. verbalized understanding of medications and d/c instructions.   All belongings (from locker #  59-wallet, [place]-DL, SS car, [employer]-ID, Visa debit, shoe laces, 2 lighters and pink jacket) returned to pt. Q 15 min checks maintained until discharge.  Pt. Left the unit in no apparent distress.

## 2015-03-12 NOTE — Discharge Summary (Signed)
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 Cocaine use disorder, mild, abuse [F14.10] 03/10/2015  . Anxiety disorder [F41.9] 03/10/2015  . Cannabis use disorder, severe, dependence (HCC) [F12.20] 03/10/2015    Past Psychiatric History:  See above noted  Past Medical History:  Past Medical History  Diagnosis Date  . No pertinent past medical history     Past Surgical History  Procedure Laterality Date  . No past surgeries     Family History:  Family History  Problem Relation Age of Onset  . Other Neg Hx   . Diabetes Mother    Family Psychiatric  History:  Denied Social History:  History  Alcohol Use  . 0.0 oz/week  . 0 Glasses of wine per week    Comment: occasional (once a month or less)     History  Drug Use  . Yes  . Special: Marijuana, MDMA (Ecstacy), Cocaine    Comment: Marijuana one week ago    Social History   Social History  . Marital Status: Single    Spouse Name: N/A  . Number of Children: N/A  . Years of Education: N/A   Social History Main Topics  . Smoking status: Current Some Day Smoker -- 0.25 packs/day for 4 years    Types: Cigarettes  . Smokeless tobacco: Never Used  . Alcohol Use: 0.0 oz/week    0 Glasses of wine per week     Comment: occasional (once a month or less)  . Drug Use: Yes    Special: Marijuana, MDMA  (Ecstacy), Cocaine     Comment: Marijuana one week ago  . Sexual Activity: Yes    Birth Control/ Protection: Condom   Other Topics Concern  . None   Social History Narrative    Hospital Course:  Fantashia Shupert, 25 y.o. Female, with hx of depression, anxiety unspecified, and substance abuse came accompanied family to [facility] reporting that patient had bizarre behavior, laughing and crying all night, not sleeping for a week, not eating, hyper religiosity, paranoia.  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  She was treated with the following medications, Zoloft 25 mg po daily for affective sx, Abilify 2 mg po qhs for psychosis/augment the Zoloft, Cogentin 0.5 mg po qhs for EPS, PRN medications as per agitation protocol.   Zissy Hamlett was discharged with current medication and was instructed on how to take medications as prescribed; (details listed below under Medication List).  Medical problems were identified and treated as needed.  Home medications were restarted as appropriate.  Improvement was monitored by observation and Joni Reining daily report of symptom reduction.  Emotional and mental status was monitored by daily self-inventory reports completed by Joni Reining and clinical staff.  Patient was seen as less anxious, less paranoid, is tolerating her medications well.  Patient requested to get STD testing  done, reassurance provided (HIV screen and RPR non reactive).  Additionally, CT scan head- no acute abnormality ( 03/08/15).  Per nursing staff, there were no disruptive issues noted on the unit.       Lindalou Soltis was evaluated by the treatment team for stability and plans for continued recovery upon discharge.  Tywana Robotham motivation was an integral factor for scheduling further treatment.  Employment, transportation, bed availability, health status, family support, and  any pending legal issues were also considered during her hospital stay.  She was offered further treatment options upon discharge including but not limited to Residential, Intensive Outpatient, and Outpatient treatment.  Sholanda Croson will follow up with the services as listed below under Follow Up Information.     Upon completion of this admission the Roni Friberg was both mentally and medically stable for discharge denying suicidal/homicidal ideation, auditory/visual/tactile hallucinations, delusional thoughts and paranoia.     Physical Findings: AIMS: Facial and Oral Movements Muscles of Facial Expression: None, normal Lips and Perioral Area: None, normal Jaw: None, normal Tongue: None, normal,Extremity Movements Upper (arms, wrists, hands, fingers): None, normal Lower (legs, knees, ankles, toes): None, normal, Trunk Movements Neck, shoulders, hips: None, normal, Overall Severity Severity of abnormal movements (highest score from questions above): None, normal Incapacitation due to abnormal movements: None, normal Patient's awareness of abnormal movements (rate only patient's report): No Awareness, Dental Status Current problems with teeth and/or dentures?: No Does patient usually wear dentures?: No  CIWA:    COWS:     Musculoskeletal: Strength & Muscle Tone: within normal limits Gait & Station: normal Patient leans: N/A  Psychiatric Specialty Exam: SEE ABOVE NOTED  Review of Systems  All other systems reviewed and are negative.   Blood pressure 131/88, pulse 88, temperature 98.6 F (37 C), temperature source Oral, resp. rate 16, height 5' 4.57" (1.64 m), weight 69.945 kg (154 lb 3.2 oz), last menstrual period 03/08/2015, SpO2 100 %.Body mass index is 26.01 kg/(m^2).  Have you used any form of tobacco in the last 30 days? (Cigarettes, Smokeless Tobacco, Cigars, and/or Pipes): Yes  Has this patient used any form of tobacco in the last 30 days? (Cigarettes, Smokeless Tobacco,  Cigars, and/or Pipes) Yes, No  Blood Alcohol level:  Lab Results  Component Value Date   ETH <5 03/08/2015    Metabolic Disorder Labs:  Lab Results  Component Value Date   HGBA1C 5.5 03/11/2015   MPG 111 03/11/2015   No results found for: PROLACTIN Lab Results  Component Value Date   CHOL 157 03/11/2015   TRIG 63 03/11/2015   HDL 62 03/11/2015   CHOLHDL 2.5 03/11/2015   VLDL 13 03/11/2015   LDLCALC 82 03/11/2015    See Psychiatric Specialty Exam and Suicide Risk Assessment completed by Attending Physician prior to discharge.  Discharge destination:  Home  Is patient on multiple antipsychotic therapies at discharge:  No   Has Patient had three or more failed trials of antipsychotic monotherapy by history:  No  Recommended Plan for Multiple Antipsychotic Therapies: NA     Medication List    TAKE these medications      Indication   ARIPiprazole 2 MG tablet  Commonly known as:  ABILIFY  Take 1 tablet (2 mg total) by mouth at bedtime.   Indication:  mood stanilization     benztropine 0.5 MG tablet  Commonly known as:  COGENTIN  Take 1 tablet (0.5 mg total) by mouth at bedtime.   Indication:  Extrapyramidal Reaction caused  by Medications     sertraline 25 MG tablet  Commonly known as:  ZOLOFT  Take 1 tablet (25 mg total) by mouth daily.   Indication:  Major Depressive Disorder           Follow-up Information    Follow up with [facility].   Contact information:   [address]  [place]  [phone]      Follow-up recommendations:  Activity:  as tol Diet:  as tol  Comments:  1.  Take all your medications as prescribed.              2.  Report any adverse side effects to outpatient provider.                       3.  Patient instructed to not use alcohol or illegal drugs while on prescription medicines.            4.  In the event of worsening symptoms, instructed patient to call 911, the crisis hotline or go to nearest  emergency room for evaluation of symptoms.  Signed: Lindwood Qua, NP [facility] 03/12/2015, 10:46 AM

## 2015-03-12 NOTE — Tx Team (Signed)
Interdisciplinary Treatment Plan Update (Adult)  Date:  03/12/2015 Time Reviewed:  1:21 PM  Progress in Treatment: Attending groups: Yes. Participating in groups: Yes. Taking medication as prescribed:  Yes. Tolerating medication:  Yes. Family/Significant othe contact made:  Yes, individual(s) contacted:  Manuel Lawhead [phone] Patient understands diagnosis:  Yes "iIneed to not use cocaine." Discussing patient identified problems/goals with staff:  Yes, see initial care plan. Medical problems stabilized or resolved:  Yes Denies suicidal/homicidal ideation: Yes. Issues/concerns per patient self-inventory: No. Other:  New problem(s) identified:   Discharge Plan or Barriers: See below  Reason for Continuation of Hospitalization:   Comments: Brenda Hamilton is a 25 y.o. AA female, who is single , employed , lives with her mother in [place] , who has a hx of depression, ?anxiety unspecified , and substance abuse , who presented accompanied by her mom, sister and brother to [facility] ,who reported symptoms of bizarre behavior, laughing and crying all night, not sleeping for a week, not eating, hyper religiosity, paranoia about others thinking bad thoughts about her. Abilify, Congentin, Zoloft trial   Estimated length of stay: Pt will discharge today.  New goal(s):  Review of initial/current patient goals per problem list:  1. Goal(s): Patient will participate in aftercare plan  Met: Yes   Target date: at discharge  As evidenced by: Patient will participate within aftercare plan AEB aftercare provider and housing plan at discharge being identified. 03/10/15: Pt will return home an follow-up outpt with [facility].   2. Goal (s): Patient will exhibit decreased depressive symptoms and suicidal ideations.   Met: Yes   Target date: 3-5 days post admission date   As evidenced by: Patient will utilize self rating of depression at 3 or below and demonstrate  decreased signs of depression or be deemed stable for discharge by MD.  03/10/15:  Pt denies SI, rates depression a 5 03/12/15: Pt denies SI and depressive sx. . 4. Goal(s): Patient will demonstrate decreased signs of psychosis.  Met: Yes  Target date:at discharge  As evidenced by: Patient will demonstrate decreased signs of psychosis as evidenced by a reduction in AVH, paranoia, and/or delusions.   03/10/15: Pt appears to be responding to internal stimuli as evidenced by inappropriate laughter, thought blocking, and disorganized thinking.  03/12/15: Pt denies AVH and is at baseline.  Attendees: Patient:  03/12/2015 1:21 PM  Family:   03/12/2015 1:21 PM  Physician:  Dr. Ursula Alert, MD 03/12/2015 1:21 PM  Nursing: Hedy Jacob, RN 03/12/2015 1:21 PM  Case Manager:  Roque Lias, [place] 03/12/2015 1:21 PM  Counselor:  Matthew Saras, MSW Intern 03/12/2015 1:21 PM  Other:   03/12/2015 1:21 PM  Other:   03/12/2015 1:21 PM  Other:   03/12/2015 1:21 PM  Other:  03/12/2015 1:21 PM  Other:    Other:    Other:    Other:    Other:    Other:      Scribe for Treatment Team:   Georga Kaufmann, MSW Intern 03/12/2015 1:21 PM

## 2015-03-12 NOTE — Progress Notes (Signed)
D: Pt presents anxious in affect and pleasant in mood. Pt reports that she had one occurrence of hearing voices today.  Pt is inquiring about her continued use of medications due to her psychotic symptoms being substance induced. Pt was encouraged to speak with the psychiatrist in regards to her medication therapy. Pt was informed that she should not abruptly stop taking her medications without the instructions of the physician. Pt is receptive to information at this time. Pt is denying any current SI/HI/AVH. No physical concerns voiced by pt. Pt is receptive to treatment.  A: Writer administered scheduled and prn medications to pt, per MD orders. Continued support and availability as needed was extended to this pt. Staff continues to monitor pt with q73min checks.  R: No adverse drug reactions noted. Pt receptive to treatment. Pt remains safe at this time.

## 2015-03-12 NOTE — Progress Notes (Signed)
  [facility] Adult Case Management Discharge Plan :  Will you be returning to the same living situation after discharge:  Yes,  yes At discharge, do you have transportation home?: Yes,  family will transport Do you have the ability to pay for your medications: Yes,  pt has insurance.  Release of information consent forms completed and in the chart;  Patient's signature needed at discharge.  Patient to Follow up at: Follow-up Information    Follow up with [facility]. Go on 04/06/2015.   Why:  :00p for med managment apt with [place]    Contact information:   [address]    [phone]      Next level of care provider has access to [place] Link:  Safety Planning and Suicide Prevention discussed: Yes,  yes  Have you used any form of tobacco in the last 30 days? (Cigarettes, Smokeless Tobacco, Cigars, and/or Pipes): Yes  Has patient been referred to the Quitline?: Patient refused referral  Patient has been referred for addiction treatment: N/A  [name] 03/12/2015, 1:23 PM

## 2015-03-12 NOTE — BHH Suicide Risk Assessment (Signed)
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 Cocaine use disorder, mild, abuse [F14.10] 03/10/2015  . Anxiety disorder [F41.9] 03/10/2015  . Cannabis use disorder, severe, dependence (HCC) [F12.20] 03/10/2015    Total Time spent with patient: 30 minutes  Musculoskeletal: Strength & Muscle Tone: within normal limits Gait & Station: normal Patient leans: N/A  Psychiatric Specialty Exam: Review of Systems  Psychiatric/Behavioral: Positive for substance abuse. Negative for depression, suicidal ideas and hallucinations. The patient is not nervous/anxious.   All other systems reviewed and are negative.   Blood pressure 131/88, pulse 88, temperature 98.6 F (37 C), temperature source Oral, resp. rate 16, height 5' 4.57" (1.64 m), weight 69.945 kg (154 lb 3.2 oz), last menstrual period 03/08/2015, SpO2 100 %.Body mass index is 26.01 kg/(m^2).  General Appearance: Casual  Eye Contact::  Fair  Speech:  Clear and Coherent409  Volume:  Normal  Mood:  Euthymic  Affect:  Appropriate  Thought Process:  Coherent  Orientation:  Full (Time, Place, and Person)  Thought Content:  WDL  Suicidal Thoughts:  No  Homicidal Thoughts:  No  Memory:  Immediate;   Fair Recent;   Fair Remote;   Fair  Judgement:  Fair  Insight:  Fair  Psychomotor Activity:  Normal  Concentration:  Fair  Recall:  [employer] of Knowledge:Fair  Language: Fair  Akathisia:  No  Handed:  Right  AIMS (if indicated):     Assets:  Communication Skills Desire for Improvement  Sleep:  Number of Hours: 6.75  Cognition: WNL  ADL's:  Intact   Mental Status Per Nursing Assessment::   On Admission:     Demographic Factors:   NA  Loss Factors: NA  Historical Factors: Impulsivity  Risk Reduction Factors:   Positive social support  Continued Clinical Symptoms:  Previous Psychiatric Diagnoses and Treatments  Cognitive Features That Contribute To Risk:  Polarized thinking    Suicide Risk:  Minimal: No identifiable suicidal ideation.  Patients presenting with no risk factors but with morbid ruminations; may be classified as minimal risk based on the severity of the depressive symptoms    Plan Of Care/Follow-up recommendations:  Activity:  no restrictions Diet:  regular Tests:  as needed Other:  follow up with aftercare  Tyden Kann, MD 03/12/2015, 9:27 AM

## 2015-06-29 ENCOUNTER — Encounter (HOSPITAL_COMMUNITY): Payer: Self-pay | Admitting: Emergency Medicine

## 2015-06-29 ENCOUNTER — Inpatient Hospital Stay
Admit: 2015-06-29 | Discharge: 2015-07-02 | DRG: 897 | Disposition: A | Discharge status: Home / Self Care | Payer: BLUE CROSS/BLUE SHIELD | Source: Intra-hospital | Attending: Psychiatry | Admitting: Psychiatry

## 2015-06-29 ENCOUNTER — Emergency Department (HOSPITAL_COMMUNITY)
Admission: EM | Admit: 2015-06-29 | Discharge: 2015-06-29 | Disposition: A | Discharge status: Other Acute Inpatient Hospital | Payer: BLUE CROSS/BLUE SHIELD | Attending: Emergency Medicine | Admitting: Emergency Medicine

## 2015-06-29 DIAGNOSIS — F1490 Cocaine use, unspecified, uncomplicated: Secondary | ICD-10-CM | POA: Diagnosis present

## 2015-06-29 DIAGNOSIS — G4700 Insomnia, unspecified: Secondary | ICD-10-CM | POA: Diagnosis present

## 2015-06-29 DIAGNOSIS — F1510 Other stimulant abuse, uncomplicated: Secondary | ICD-10-CM

## 2015-06-29 DIAGNOSIS — F1410 Cocaine abuse, uncomplicated: Secondary | ICD-10-CM | POA: Insufficient documentation

## 2015-06-29 DIAGNOSIS — F29 Unspecified psychosis not due to a substance or known physiological condition: Secondary | ICD-10-CM | POA: Insufficient documentation

## 2015-06-29 DIAGNOSIS — Z833 Family history of diabetes mellitus: Secondary | ICD-10-CM

## 2015-06-29 DIAGNOSIS — F15959 Other stimulant use, unspecified with stimulant-induced psychotic disorder, unspecified: Secondary | ICD-10-CM

## 2015-06-29 DIAGNOSIS — F1290 Cannabis use, unspecified, uncomplicated: Secondary | ICD-10-CM | POA: Diagnosis present

## 2015-06-29 DIAGNOSIS — F17200 Nicotine dependence, unspecified, uncomplicated: Secondary | ICD-10-CM

## 2015-06-29 DIAGNOSIS — F17210 Nicotine dependence, cigarettes, uncomplicated: Secondary | ICD-10-CM | POA: Diagnosis present

## 2015-06-29 DIAGNOSIS — F15159 Other stimulant abuse with stimulant-induced psychotic disorder, unspecified: Principal | ICD-10-CM | POA: Diagnosis present

## 2015-06-29 DIAGNOSIS — F1220 Cannabis dependence, uncomplicated: Secondary | ICD-10-CM | POA: Diagnosis present

## 2015-06-29 HISTORY — DX: Anxiety disorder, unspecified: F41.9

## 2015-06-29 HISTORY — DX: Cannabis dependence, uncomplicated: F12.20

## 2015-06-29 HISTORY — DX: Cannabis dependence with psychotic disorder with delusions: F12.250

## 2015-06-29 HISTORY — DX: Cocaine abuse, uncomplicated: F14.10

## 2015-06-29 HISTORY — DX: Other stimulant use, unspecified with stimulant-induced psychotic disorder, unspecified: F15.959

## 2015-06-29 LAB — COMPREHENSIVE METABOLIC PANEL
ALBUMIN: 5 g/dL (ref 3.5–5.0)
ALT: 18 U/L (ref 14–54)
AST: 17 U/L (ref 15–41)
Alkaline Phosphatase: 48 U/L (ref 38–126)
Anion gap: 8 (ref 5–15)
BILIRUBIN TOTAL: 0.6 mg/dL (ref 0.3–1.2)
BUN: 15 mg/dL (ref 6–20)
CHLORIDE: 105 mmol/L (ref 101–111)
CO2: 25 mmol/L (ref 22–32)
Calcium: 9.7 mg/dL (ref 8.9–10.3)
Creatinine, Ser: 0.84 mg/dL (ref 0.44–1.00)
Glucose, Bld: 120 mg/dL — ABNORMAL HIGH (ref 65–99)
POTASSIUM: 3.3 mmol/L — AB (ref 3.5–5.1)
SODIUM: 138 mmol/L (ref 135–145)
TOTAL PROTEIN: 8.5 g/dL — AB (ref 6.5–8.1)

## 2015-06-29 LAB — URINALYSIS, ROUTINE W REFLEX MICROSCOPIC
GLUCOSE, UA: NEGATIVE mg/dL
Hgb urine dipstick: NEGATIVE
Ketones, ur: 40 mg/dL — AB
LEUKOCYTES UA: NEGATIVE
Nitrite: NEGATIVE
PH: 6 (ref 5.0–8.0)
Protein, ur: 30 mg/dL — AB
SPECIFIC GRAVITY, URINE: 1.038 — AB (ref 1.005–1.030)

## 2015-06-29 LAB — RAPID URINE DRUG SCREEN, HOSP PERFORMED
AMPHETAMINES: POSITIVE — AB
BENZODIAZEPINES: NOT DETECTED
Barbiturates: NOT DETECTED
COCAINE: NOT DETECTED
Opiates: NOT DETECTED
Tetrahydrocannabinol: POSITIVE — AB

## 2015-06-29 LAB — URINE MICROSCOPIC-ADD ON

## 2015-06-29 LAB — CBC WITH DIFFERENTIAL/PLATELET
BASOS ABS: 0 10*3/uL (ref 0.0–0.1)
BASOS PCT: 0 %
EOS PCT: 0 %
Eosinophils Absolute: 0 10*3/uL (ref 0.0–0.7)
HCT: 33.1 % — ABNORMAL LOW (ref 36.0–46.0)
Hemoglobin: 11.2 g/dL — ABNORMAL LOW (ref 12.0–15.0)
LYMPHS PCT: 37 %
Lymphs Abs: 2.1 10*3/uL (ref 0.7–4.0)
MCH: 29 pg (ref 26.0–34.0)
MCHC: 33.8 g/dL (ref 30.0–36.0)
MCV: 85.8 fL (ref 78.0–100.0)
MONO ABS: 0.6 10*3/uL (ref 0.1–1.0)
Monocytes Relative: 11 %
NEUTROS ABS: 3 10*3/uL (ref 1.7–7.7)
Neutrophils Relative %: 52 %
PLATELETS: 251 10*3/uL (ref 150–400)
RBC: 3.86 MIL/uL — AB (ref 3.87–5.11)
RDW: 12.5 % (ref 11.5–15.5)
WBC: 5.7 10*3/uL (ref 4.0–10.5)

## 2015-06-29 LAB — SALICYLATE LEVEL: Salicylate Lvl: <4 mg/dL (ref 2.8–30.0)

## 2015-06-29 LAB — ACETAMINOPHEN LEVEL

## 2015-06-29 LAB — POC URINE PREG, ED: Preg Test, Ur: NEGATIVE

## 2015-06-29 LAB — ETHANOL

## 2015-06-29 LAB — CBG MONITORING, ED: GLUCOSE-CAPILLARY: 133 mg/dL — AB (ref 65–99)

## 2015-06-29 MED ORDER — IBUPROFEN 600 MG PO TABS: 600.0000 mg | ORAL_TABLET | Freq: Three times a day (TID) | ORAL | Status: DC | PRN

## 2015-06-29 MED ORDER — HALOPERIDOL 5 MG PO TABS: 5.0000 mg | ORAL_TABLET | Freq: Three times a day (TID) | ORAL | Status: DC | PRN

## 2015-06-29 MED ORDER — ZOLPIDEM TARTRATE 5 MG PO TABS: 5.0000 mg | ORAL_TABLET | Freq: Every evening | ORAL | Status: DC | PRN

## 2015-06-29 MED ORDER — ACETAMINOPHEN 325 MG PO TABS: 650.0000 mg | ORAL_TABLET | ORAL | Status: DC | PRN

## 2015-06-29 MED ORDER — IBUPROFEN 200 MG PO TABS: 600.0000 mg | ORAL_TABLET | Freq: Three times a day (TID) | ORAL | Status: DC | PRN

## 2015-06-29 MED ORDER — LORAZEPAM 2 MG/ML IJ SOLN: 2.0000 mg | INTRAMUSCULAR | Status: DC | PRN

## 2015-06-29 MED ORDER — LORAZEPAM 2 MG/ML IJ SOLN: 0.0000 mg | Freq: Two times a day (BID) | INTRAMUSCULAR | Status: DC

## 2015-06-29 MED ORDER — LORAZEPAM 2 MG/ML IJ SOLN
2.0000 mg | Freq: Once | INTRAMUSCULAR | Status: DC | PRN
  Filled 2015-06-29: qty 1

## 2015-06-29 MED ORDER — MAGNESIUM HYDROXIDE 400 MG/5ML PO SUSP: 30.0000 mL | Freq: Every day | ORAL | Status: DC | PRN

## 2015-06-29 MED ORDER — ONDANSETRON HCL 4 MG PO TABS: 4.0000 mg | ORAL_TABLET | Freq: Three times a day (TID) | ORAL | Status: DC | PRN

## 2015-06-29 MED ORDER — LORAZEPAM 2 MG/ML IJ SOLN: 0.0000 mg | Freq: Four times a day (QID) | INTRAMUSCULAR | Status: DC

## 2015-06-29 MED ORDER — LORAZEPAM 2 MG/ML IJ SOLN
0.0000 mg | Freq: Four times a day (QID) | INTRAMUSCULAR | Status: DC
  Administered 2015-06-29: 2 mg via INTRAVENOUS
  Filled 2015-06-29: qty 1

## 2015-06-29 MED ORDER — DIPHENHYDRAMINE HCL 25 MG PO CAPS: 50.0000 mg | ORAL_CAPSULE | Freq: Three times a day (TID) | ORAL | Status: DC | PRN

## 2015-06-29 MED ORDER — NICOTINE 21 MG/24HR TD PT24: 21.0000 mg | MEDICATED_PATCH | Freq: Every day | TRANSDERMAL | Status: DC

## 2015-06-29 MED ORDER — LORAZEPAM 2 MG PO TABS
2.0000 mg | ORAL_TABLET | ORAL | Status: DC | PRN
  Administered 2015-07-01: 2 mg via ORAL
  Filled 2015-06-29: qty 1

## 2015-06-29 MED ORDER — ALUM & MAG HYDROXIDE-SIMETH 200-200-20 MG/5ML PO SUSP: 30.0000 mL | ORAL | Status: DC | PRN

## 2015-06-29 MED ORDER — VITAMIN B-1 100 MG PO TABS
100.0000 mg | ORAL_TABLET | Freq: Every day | ORAL | Status: DC
  Administered 2015-06-29: 100 mg via ORAL
  Filled 2015-06-29: qty 1

## 2015-06-29 MED ORDER — DIPHENHYDRAMINE HCL 50 MG/ML IJ SOLN: 50.0000 mg | Freq: Three times a day (TID) | INTRAMUSCULAR | Status: DC | PRN

## 2015-06-29 MED ORDER — HALOPERIDOL LACTATE 5 MG/ML IJ SOLN: 5.0000 mg | Freq: Three times a day (TID) | INTRAMUSCULAR | Status: DC | PRN

## 2015-06-29 MED ORDER — LORAZEPAM 1 MG PO TABS: 0.0000 mg | ORAL_TABLET | Freq: Two times a day (BID) | ORAL | Status: DC

## 2015-06-29 MED ORDER — LORAZEPAM 1 MG PO TABS
1.0000 mg | ORAL_TABLET | Freq: Three times a day (TID) | ORAL | Status: DC | PRN
  Administered 2015-06-29: 1 mg via ORAL
  Filled 2015-06-29: qty 1

## 2015-06-29 MED ORDER — NICOTINE 21 MG/24HR TD PT24
21.0000 mg | MEDICATED_PATCH | Freq: Every day | TRANSDERMAL | Status: DC
  Filled 2015-06-29: qty 1

## 2015-06-29 MED ORDER — TRAZODONE HCL 100 MG PO TABS: 100.0000 mg | ORAL_TABLET | Freq: Every evening | ORAL | Status: DC | PRN

## 2015-06-29 MED ORDER — HALOPERIDOL LACTATE 5 MG/ML IJ SOLN
5.0000 mg | Freq: Once | INTRAMUSCULAR | Status: AC | PRN
  Administered 2015-06-29: 5 mg via INTRAMUSCULAR
  Filled 2015-06-29: qty 1

## 2015-06-29 MED ORDER — THIAMINE HCL 100 MG/ML IJ SOLN: 100.0000 mg | Freq: Every day | INTRAMUSCULAR | Status: DC

## 2015-06-29 MED ORDER — ACETAMINOPHEN 325 MG PO TABS: 650.0000 mg | ORAL_TABLET | Freq: Four times a day (QID) | ORAL | Status: DC | PRN

## 2015-06-29 MED ORDER — LORAZEPAM 1 MG PO TABS
0.0000 mg | ORAL_TABLET | Freq: Four times a day (QID) | ORAL | Status: DC
Start: 2015-06-29 — End: 2015-06-29

## 2015-06-29 NOTE — ED Notes (Signed)
Patient transferred to Rm 39. Belongings given to psych ED techs. Patient escorted to room 39 without incident. Pt is calm and cooperative.

## 2015-06-29 NOTE — ED Notes (Signed)
Patient was given lorazepam 4 mg IM prior to coming back to the [facility].  She has been up and wandering around with a very unsteady gait.  She has not fallen and she has been strongly encouraged to lie down on her bed.  She came to the window a few minutes ago and asked if she could leave, when I told her not yet she was fine with that.  When she first came to the unit she was wandering into other patient rooms, but seems to know where hers is now and this has not been an issue for the past 2 hours or so.  [facility].

## 2015-06-29 NOTE — ED Notes (Signed)
Bed: [id number] Expected date:  Expected time:  Means of arrival:  Comments: RM 25

## 2015-06-29 NOTE — ED Notes (Signed)
PA at bedside.

## 2015-06-29 NOTE — ED Notes (Signed)
Pt brought into triage by registration staff. Pt's mother sts " she isn't acting her normal self" Pt took molly over the weekend, and hasn't been acting right since. Pt denies SI/HI. Pt sts she feels like her normal self, and that nothing is wrong. Pt acting a little anxious in triage. Pt was told  We are moving her to a room in the back, at this time the pt decided to walk out the door. After explaining to charge Nurse what was going on , Writer walked outside to check on the pt and the family was still outside trying to talk the pt into staying. Pt's mother sts she isn't letting her daughter come home until she gets help. Pt then started walking around the parking lot outside.
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
## 2015-06-29 NOTE — BH Assessment (Signed)
BHH Assessment Progress Note  Per Thedore MinsMojeed Akintayo, MD, this pt continues to require psychiatric hospitalization at this time.  At 15:15 Dr Ardyth HarpsHernandez reports that she has accepted pt to [facility], Rm 322.  Nanine MeansJamison Lord, DNP, concurs with this decision.  Pt presents under IVC initiated by EDP April Palumbo, MD, and IVC documents have been faxed to [phone].  Pt's nurse, Rudean HittDawnaly, has been notified, and agrees to call report to [phone].  [facility]  Doylene Canninghomas Delford Wingert, MA Triage Specialist [phone]
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
## 2015-06-29 NOTE — Progress Notes (Signed)
Admission Note:  D: Pt appeared depressed  With  a flat affect.  Pt is redirectable and cooperative with assessment.      A: Pt admitted to unit per protocol, skin assessment  With no breaks in skin. and search done and no contraband found.  Pt  educated on therapeutic milieu rules. Pt was introduced to milieu by nursing staff.    R: Pt was receptive to education about the milieu .  15 min safety checks started. Clinical research associatewriter offered support

## 2015-06-29 NOTE — ED Notes (Signed)
[facility] to [facility]  [facility]  All belongings given to the [place].  [facility].  Patients mother is aware of the move and was given the phone number to the unit.
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
## 2015-06-29 NOTE — ED Notes (Signed)
TTS at bedside. 

## 2015-06-29 NOTE — ED Notes (Signed)
Out to parking lot, patient's Mother and brother attempting to get patient to come in to ED for evaluation. Patient agreeable to come in and "talk" states "I just want peace".  Patient declines any further treatment at this time, walked back to Room 25 with Mother and brother. Will await EDP evaluation

## 2015-06-29 NOTE — ED Notes (Signed)
Patient has one bag of belongings in locker 29. 

## 2015-06-29 NOTE — ED Notes (Signed)
Writer asked pt if she would change into scrubs and blood work, pt refused, Solicitorwriter notified RN and charge nurse.

## 2015-06-29 NOTE — ED Notes (Signed)
MD at bedside. 

## 2015-06-29 NOTE — Progress Notes (Signed)
CM [place]cp.  CM left written information to assist pt with determining choice for uninsured accepting pcps, discussed the importance of pcp vs EDP services for f/u care, www.needymeds.org, www.goodrx.com, discounted pharmacies and other [place] resources such as [facility] , [facility], affordable care act, financial assistance, uninsured dental services, [place] med assist, DSS and  health department  Reviewed resources for [place] uninsured accepting pcps like [facility], family medicine at [address], [facility], [facility], local urgent care centers, [facility], [facility], general medical clinics, family services of the [place], [facility] urgent care plus others, medication resources, [facility] out patient pharmacies and housing Provided [facility] contact information

## 2015-06-29 NOTE — ED Notes (Signed)
Pt belongings: Boots, blue socks, leggings, tshirt, bra, purse.

## 2015-06-30 DIAGNOSIS — F17200 Nicotine dependence, unspecified, uncomplicated: Secondary | ICD-10-CM

## 2015-06-30 DIAGNOSIS — F15951 Other stimulant use, unspecified with stimulant-induced psychotic disorder with hallucinations: Secondary | ICD-10-CM

## 2015-06-30 DIAGNOSIS — F1510 Other stimulant abuse, uncomplicated: Secondary | ICD-10-CM

## 2015-06-30 LAB — LIPID PANEL
CHOL/HDL RATIO: 2.3 ratio
CHOLESTEROL: 166 mg/dL (ref 0–200)
HDL: 72 mg/dL (ref >40)
LDL Cholesterol: 79 mg/dL (ref 0–99)
TRIGLYCERIDES: 73 mg/dL (ref <150)
VLDL: 15 mg/dL (ref 0–40)

## 2015-06-30 LAB — HEMOGLOBIN A1C: HEMOGLOBIN A1C: 5.3 % (ref 4.0–6.0)

## 2015-06-30 LAB — TSH: TSH: 0.758 u[IU]/mL (ref 0.350–4.500)

## 2015-06-30 MED ORDER — OLANZAPINE 7.5 MG PO TABS
15.0000 mg | ORAL_TABLET | Freq: Every day | ORAL | Status: DC
  Administered 2015-06-30 – 2015-07-01 (×2): 15 mg via ORAL
  Filled 2015-06-30 (×2): qty 2

## 2015-06-30 MED ORDER — ARIPIPRAZOLE 5 MG PO TABS: 15.0000 mg | ORAL_TABLET | Freq: Every day | ORAL | Status: DC

## 2015-06-30 NOTE — [facility] Group Notes (Signed)
[facility] LCSW Group Therapy  06/30/2015 2:24 PM  Type of Therapy:  Group Therapy  Participation Level:  Minimal  Participation Quality:  Attentive  Affect:  Flat  Cognitive:  Alert  Insight:  Limited  Engagement in Therapy:  Limited  Modes of Intervention:  Discussion, Education, Socialization and Support  Summary of Progress/Problems: Emotional Regulation: Patients will identify both negative and positive emotions. They will discuss emotions they have difficulty regulating and how they impact their lives. Patients will be asked to identify healthy coping skills to combat unhealthy reactions to negative emotions.  Pt attended group and stayed the entire time. Pt sat quietly and listened to other group members share.   Audie Wieser L [place] Current MSW, LCSWA  06/30/2015, 2:24 PM  

## 2015-06-30 NOTE — Tx Team (Signed)
Initial Interdisciplinary Treatment Plan   PATIENT STRESSORS: Substance abuse   PATIENT STRENGTHS: Capable of independent living Supportive family/friends   PROBLEM LIST: Problem List/Patient Goals Date to be addressed Date deferred Reason deferred Estimated date of resolution  Polysubstance use disorder 06/29/2015                                                      DISCHARGE CRITERIA:  Improved stabilization in mood, thinking, and/or behavior Need for constant or close observation no longer present Safe-care adequate arrangements made  PRELIMINARY DISCHARGE PLAN: Attend aftercare/continuing care group Outpatient therapy Return to previous living arrangement  PATIENT/FAMIILY INVOLVEMENT: This treatment plan has been presented to and reviewed with the patient, Joni ReiningSamantha Lorson.  The patient and family have been given the opportunity to ask questions and make suggestions.  Kalisa Girtman T Dorien Mayotte 06/30/2015, 3:10 AM

## 2015-06-30 NOTE — [facility] Group Notes (Signed)
[facility] Group Notes:  (Nursing/MHT/Case Management/Adjunct)  Date:  06/30/2015  Time:  5:20 PM  Type of Therapy:  Psychoeducational Skills  Participation Level:  Did Not Attend    Mickey Farberamela M Moses Ellison 06/30/2015, 5:20 PM

## 2015-06-30 NOTE — [facility] Group Notes (Signed)
[facility] Group Notes:  (Nursing/MHT/Case Management/Adjunct)  Date:  06/30/2015  Time:  11:00 PM  Type of Therapy:  Evening Wrap-up Group  Participation Level:  Did Not Attend  Participation Quality:  N/A  Affect:  N/A  Cognitive:  N/A  Insight:  None  Engagement in Group:  None  Modes of Intervention:  Discussion  Summary of Progress/Problems:  Tomasita MorrowChelsea Nanta [name] 06/30/2015, 11:00 PM

## 2015-06-30 NOTE — Plan of Care (Signed)
Problem: Education: Goal: Knowledge of disease or condition will improve Outcome: Not Progressing Decreased LOC and inability to provide insights are lacking at this time; unable to provide teaching, nursing staffs will continue to maintain safety; monitor for complications and manage symptoms with medications.

## 2015-06-30 NOTE — Progress Notes (Signed)
Recreation Therapy Notes  Date: 06.14.17 Time: 9:30 am Location: Craft Room  Group Topic: Self-esteem  Goal Area(s) Addresses:  Patient will identify at least one positive trait about self. Patient will identify at least one healthy coping skill.  Behavioral Response: Did not attend  Intervention: All About Me  Activity: Patients were instructed to make a pamphlet including their life's motto, positive traits, healthy coping skills, and their support system.  Education: LRT educated patients on ways they can increase their self-esteem.  Education Outcome: Patient did not attend group.   Clinical Observations/Feedback: Patient did not attend group.  Jacquelynn CreeGreene,Beyla Loney M, LRT/CTRS 06/30/2015 1:12 PM
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
## 2015-06-30 NOTE — Progress Notes (Signed)
First am patient verbalizing concerns about having blood drawn because she cannot afford to be here.  Refused to eat breakfast. Later in morning patient up requesting supplies to shower.  Visible in the milieu interacting with peers and staff.  Not forthcoming about what brought her into the hospital.  Attending some groups.  Denies SI/HI/AVH.  Support and encouragement offered.  Safety maintained.

## 2015-06-30 NOTE — Progress Notes (Signed)
Mostly in her room, drowsy, barely awake to complete the initial nursing assessment. Denied SI/SIB, denied AV/H. Denied pain. Appearance poorly kept, fluid encouraged; no paroxysmals sweat observed, denied HA, no agitation or anxiety noted; will continue to monitor.

## 2015-06-30 NOTE — [facility] Suicide Risk Assessment (Signed)
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 Amphetamine abuse [F15.10] 06/30/2015  . Tobacco use disorder [F17.200] 06/30/2015  . 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 Cocaine use disorder, mild, abuse [F14.10] 03/10/2015  . Cannabis use disorder, severe, dependence (HCC) [F12.20] 03/10/2015   Subjective Data:   Continued Clinical Symptoms:  Alcohol Use Disorder Identification Test Final Score (AUDIT): 17 The "Alcohol Use Disorders Identification Test", Guidelines for Use in Primary Care, Second Edition.  World Science writerHealth Organization [facility](WHO). Score between 0-7:  no or low risk or alcohol related problems. Score between 8-15:  moderate risk of alcohol related problems. Score between 16-19:  high risk of alcohol related problems. Score 20 or above:  warrants further diagnostic evaluation for alcohol dependence and treatment.      Psychiatric Specialty Exam: Physical Exam  ROS  Blood pressure 124/80, pulse 73, temperature 98.3 F (36.8 C), temperature source Oral, resp. rate 16, height 5\' 4"  (1.626 m), weight 64.864 kg (143 lb), SpO2 100 %.Body mass index is 24.53 kg/(m^2).    COGNITIVE FEATURES THAT CONTRIBUTE TO RISK:  None    SUICIDE RISK:   Moderate:  Frequent suicidal ideation with limited intensity, and duration, some specificity in terms of plans, no associated intent, good self-control, limited dysphoria/symptomatology, some risk factors present, and identifiable protective factors, including available and accessible social support.  [facility]  I certify that inpatient services furnished can reasonably be  expected to improve the patient's condition.   Jimmy FootmanHernandez-Gonzalez,  Shaquandra Galano, MD 06/30/2015, 2:20 PM
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
## 2015-07-01 MED ORDER — OLANZAPINE 15 MG PO TABS: 15.0000 mg | ORAL_TABLET | Freq: Every day | ORAL | Status: DC

## 2015-07-01 NOTE — Plan of Care (Signed)
Problem: Education: Goal: Knowledge of disease or condition will improve Outcome: Not Progressing Attitude towards life style alternatives discussed and encouraged; nonchalant attitude, in denial and at risk of recidivism.

## 2015-07-01 NOTE — Progress Notes (Signed)
Recreation Therapy Notes  Date: 06.15.17 Time: 9:30 am Location: Craft Room  Group Topic: Leisure Education  Goal Area(s) Addresses:  Patient will identify activities for each letter of the alphabet. Patient will verbalize ability to integrate positive leisure into life post d/c. Patient will verbalize ability to use leisure as a coping skill.  Behavioral Response: Did not attend  Intervention: Leisure Alphabet  Activity: Patients were given a Leisure Alphabet worksheet and instructed to identify leisure activities for each letter of the alphabet.  Education: LRT educated patients on what they need to participate in leisure.  Education Outcome: Patient did not attend group.   Clinical Observations/Feedback: Patient did not attend group.  Blayne Garlick M, LRT/CTRS 07/01/2015 10:25 AM 

## 2015-07-01 NOTE — BHH Group Notes (Signed)
BHH Group Notes:  (Nursing/MHT/Case Management/Adjunct)  Date:  07/01/2015  Time:  4:08 PM  Type of Therapy:  Movement Therapy  Participation Level:  Did Not Attend  Alexis Mizuno De'Chelle Adalbert Alberto 07/01/2015, 4:08 PM
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
## 2015-07-01 NOTE — Progress Notes (Signed)
In contrast and unlike yesterday, patient was not drowsy, was alert and oriented, engaging, interacting well with peers and staffs; denies SI/SIB, denied AV/H; but still blaming her mother and so much in denial with addiction to poly substances. "I don't belong here, I can handle myself, my mother brought me here for no apparent reason.Brenda Hamilton.Brenda Hamilton."

## 2015-07-01 NOTE — [facility] Group Notes (Signed)
[facility] LCSW Group Therapy   07/01/2015 11am   Type of Therapy: Group Therapy   Participation Level: Active   Participation Quality: Attentive, Sharing and Supportive   Affect: Appropriate   Cognitive: Alert and Oriented   Insight: Developing/Improving and Engaged   Engagement in Therapy: Developing/Improving and Engaged   Modes of Intervention: Clarification, Confrontation, Discussion, Education, Exploration, Limit-setting, Orientation, Problem-solving, Rapport Building, Dance movement psychotherapisteality Testing, Socialization and Support   Summary of Progress/Problems: The topic for group was balance in life. Today's group focused on defining balance in one's own words, identifying things that can knock one off balance, and exploring healthy ways to maintain balance in life. Group members were asked to provide an example of a time when they felt off balance, describe how they handled that situation, and process healthier ways to regain balance in the future. Group members were asked to share the most important tool for maintaining balance that they [facility] and how they plan to apply this method after discharge.  Pt shared that to her feeling off balanced means an inability to have autonomy.  Pt shared in order to feel balanced she gets away from family members she has conflicts with.  Pt shared she once was not able to control her own life, so she concentrated on acceptance in order to regain her balance. Pt was polite and cooperative with the CSW and other group members and focused and attentive to the topics discussed and the sharing of others.  Dorothe PeaJonathan F. Malyn Aytes, LCSWA, LCAS  07/01/15

## 2015-07-01 NOTE — [facility] Group Notes (Signed)
[facility] Group Notes:  (Nursing/MHT/Case Management/Adjunct)  Date:  07/01/2015  Time:  11:41 PM  Type of Therapy:  Group Therapy  Participation Level:  Active  Participation Quality:  Appropriate and Attentive  Affect:  Appropriate  Cognitive:  Alert, Appropriate and Oriented  Insight:  Appropriate, Good and Improving  Engagement in Group:  Engaged  Modes of Intervention:  Discussion  Summary of Progress/Problems: Brenda Hamilton's goal was to begin to take responsibility for her actions that lead to her problems.  [name] Joy Devory Mckinzie 07/01/2015, 11:41 PM

## 2015-07-01 NOTE — Progress Notes (Signed)
Denies SI/HI/AVH.  Affect sad.  Visible in the milieu.  Minimum interaction noted with peers.  Support and encouragement offered.  Safety maintained.
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
## 2015-07-02 LAB — PROLACTIN: Prolactin: 93.5 ng/mL — ABNORMAL HIGH (ref 4.8–23.3)

## 2015-07-02 NOTE — [facility] Group Notes (Signed)
[facility] LCSW Group Therapy  07/02/2015 5:15 PM  Type of Therapy:  Group Therapy  Participation Level:  Minimal  Participation Quality:  Attentive  Affect:  Appropriate  Cognitive:  Alert  Insight:  Limited   Engagement in Therapy:  Limited   Modes of Intervention:  Discussion, Education, Socialization and Support  Summary of Progress/Problems: Feelings around Relapse. Group members discussed the meaning of relapse and shared personal stories of relapse, how it affected them and others, and how they perceived themselves during this time. Group members were encouraged to identify triggers, warning signs and coping skills used when facing the possibility of relapse. Social supports were discussed and explored in detail. Pt attended group and stayed the entire time. Pt sat quietly and listened to other group members share.    Keyandra Swenson L Akeela Busk /MSW, LCSWA  07/02/2015, 5:15 PM    

## 2015-07-02 NOTE — Plan of Care (Signed)
Problem: Health Behavior/Discharge Planning: Goal: Ability to identify changes in lifestyle to reduce recurrence of condition will improve Outcome: Not Met (add Reason) Dies not believe that she has an issue with drinking or illicit drugs.

## 2015-07-02 NOTE — Plan of Care (Signed)
Problem: Education: Goal: Understanding of discharge needs will improve Outcome: Not Met (add Reason) Does not think that she needs to take any medication or do any type of follow-up treatment
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
## 2015-07-02 NOTE — Progress Notes (Signed)
D: Pt denies SI/HI/AVH. Pt is pleasant and cooperative. Affect is flat and sad but brightens upon approach. Pt iappears less anxious and she is interacting with peers and staff appropriately.  A: Pt was offered support and encouragement. Pt was given scheduled medications. Pt was encouraged to attend groups. Q 15 minute checks were done for safety.  R:Pt attends groups and interacts well with peers and staff. Pt is taking medication. Pt has no complaints.Pt receptive to treatment and safety maintained on unit.

## 2015-07-02 NOTE — Progress Notes (Signed)
  [facility] Discharge [facility] :  Will you be returning to the same living situation after discharge:  Yes,  [place] At discharge, do you have transportation home?: Yes,  [place] and will then be picked up by her mother Do you have the ability to pay for your medications: Yes,  pt will be provided with prescriptions at discharge  Release of information consent forms completed and in the chart;  Patient's signature needed at discharge.  Patient to Follow up at: Follow-up Information    [facility].   Why:  Please arrive to the walk-in clinic Monday through Friday from 9-4pm for your assessment for your hospital follow up for medication management and therapy.   Contact information:   [facility] [address] [place], [place] [address] Phone: [phone] Fax: [phone]      Next level of care provider has access to [facility] Link:no  Safety Planning and Suicide Prevention discussed: Yes,  completed with pt  Have you used any form of tobacco in the last 30 days? (Cigarettes, Smokeless Tobacco, Cigars, and/or Pipes): Yes  Has patient been referred to the Quitline?: Patient refused referral  Patient has been referred for addiction treatment: Pt. refused referral  Dorothe PeaJonathan F Lorenz Donley 07/02/2015, 4:33 PM

## 2015-07-02 NOTE — B[facility] Suicide Risk Assessment (Signed)
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 Amphetamine abuse [F15.10] 06/30/2015  . Tobacco use disorder [F17.200] 06/30/2015  . 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 Cocaine use disorder, mild, abuse [F14.10] 03/10/2015  . Cannabis use disorder, severe, dependence (HCC) [F12.20] 03/10/2015      Psychiatric Specialty Exam: ROS  Blood pressure 140/124, pulse 73, temperature 98.8 F (37.1 C), temperature source Oral, resp. rate 18, height 5\' 4"  (1.626 m), weight 64.864 kg (143 lb), SpO2 100 %.Body mass index is 24.53 kg/(m^2).                                                       Mental Status Per Nursing Assessment::   On Admission:     Demographic Factors:  NA  Loss Factors: NA  Historical Factors: Impulsivity  Risk Reduction Factors:   Responsible for children under 24 years of age, Employed, Living with another person, especially a relative and Positive social support  Continued Clinical Symptoms:  Alcohol/Substance Abuse/Dependencies Previous Psychiatric Diagnoses and Treatments  Cognitive Features That Contribute To Risk:  Closed-mindedness    Suicide Risk:  Minimal: No identifiable suicidal ideation.  Patients presenting with no risk factors but with morbid ruminations; may be classified as minimal risk based on the severity of the depressive symptoms     Jimmy FootmanHernandez-Gonzalez,  Martine Trageser, MD 07/02/2015, 9:08 AM

## 2015-07-02 NOTE — Progress Notes (Signed)
Pleasant and cooperative.  Denies SI/HI/AVH.  Verbalizes that she is ready to go home.  Discharge instructions given, verbalized understanding.  Prescriptions given and personal belongings returned.  Escorted off the unit by this Clinical research associatewriter to doctors parking lot to be escorted to bus stop by security.

## 2015-07-02 NOTE — Progress Notes (Signed)
Recreation Therapy Notes  Date: 06.16.17 Time: 9:30 am Location: Craft Room  Group Topic: Coping Skills  Goal Area(s) Addresses:  Patient will participate in healthy coping skill. Patient will verbalize using art as a coping skill.  Behavioral Response: Did not attend  Intervention: Coloring  Activity: Patients were given coloring sheets to color and were instructed to think about what their mind was focus on and what emotions they felt.  Education: LRT educated patients on healthy coping skills.  Education Outcome: Patient did not attend group.   Clinical Observations/Feedback: Patient did not attend group.  Jacquelynn CreeGreene,Talin Feister M, LRT/CTRS 07/02/2015 10:34 AM

## 2015-07-02 NOTE — BHH Group Notes (Signed)
BHH Group Notes:  (Nursing/MHT/Case Management/Adjunct)  Date:  07/02/2015  Time:  5:32 PM  Type of Therapy:  Psychoeducational Skills  Participation Level:  Active  Participation Quality:  Appropriate, Attentive and Supportive  Affect:  Appropriate  Cognitive:  Appropriate  Insight:  Appropriate  Engagement in Group:  Supportive  Modes of Intervention:  Discussion and Education  Summary of Progress/Problems:  Brenda Hamilton 07/02/2015, 5:32 PM

## 2015-07-02 NOTE — BHH Suicide Risk Assessment (Signed)
BHH INPATIENT:  Family/Significant Other Suicide Prevention Education  Suicide Prevention Education:  Education Completed; Brenda Hamilton (ph#: [phone]) has been identified by the patient as the family member/significant other with whom the patient will be residing, and identified as the person(s) who will aid the patient in the event of a mental health crisis (suicidal ideations/suicide attempt).  With written consent from the patient, the family member/significant other has been provided the following suicide prevention education, prior to the and/or following the discharge of the patient.  The suicide prevention education provided includes the following:  Suicide risk factors  Suicide prevention and interventions  National Suicide Hotline telephone number  [facility] assessment telephone number  [facility] Assistance 911  [facility] and/or Residential Mobile Crisis Unit telephone number  Request made of family/significant other to:  Remove weapons (e.g., guns, rifles, knives), all items previously/currently identified as safety concern.    Remove drugs/medications (over-the-counter, prescriptions, illicit drugs), all items previously/currently identified as a safety concern.  The family member/significant other verbalizes understanding of the suicide prevention education information provided.  The family member/significant other agrees to remove the items of safety concern listed above.  Brenda Hamilton 07/02/2015, 1:20 PM

## 2016-04-15 ENCOUNTER — Encounter (HOSPITAL_COMMUNITY): Payer: Self-pay | Admitting: *Deleted

## 2016-04-15 ENCOUNTER — Emergency Department (HOSPITAL_COMMUNITY)
Admission: EM | Admit: 2016-04-15 | Discharge: 2016-04-16 | Disposition: A | Discharge status: Home / Self Care | Payer: No Typology Code available for payment source | Attending: Emergency Medicine | Admitting: Emergency Medicine

## 2016-04-15 ENCOUNTER — Emergency Department (HOSPITAL_COMMUNITY): Payer: Self-pay

## 2016-04-15 DIAGNOSIS — F12950 Cannabis use, unspecified with psychotic disorder with delusions: Secondary | ICD-10-CM

## 2016-04-15 DIAGNOSIS — Z79899 Other long term (current) drug therapy: Secondary | ICD-10-CM | POA: Insufficient documentation

## 2016-04-15 DIAGNOSIS — F17210 Nicotine dependence, cigarettes, uncomplicated: Secondary | ICD-10-CM | POA: Insufficient documentation

## 2016-04-15 DIAGNOSIS — F29 Unspecified psychosis not due to a substance or known physiological condition: Secondary | ICD-10-CM | POA: Insufficient documentation

## 2016-04-15 DIAGNOSIS — F1220 Cannabis dependence, uncomplicated: Secondary | ICD-10-CM | POA: Diagnosis present

## 2016-04-15 DIAGNOSIS — F23 Brief psychotic disorder: Secondary | ICD-10-CM

## 2016-04-15 LAB — COMPREHENSIVE METABOLIC PANEL
ALBUMIN: 4.8 g/dL (ref 3.5–5.0)
ALK PHOS: 47 U/L (ref 38–126)
ALT: 14 U/L (ref 14–54)
AST: 16 U/L (ref 15–41)
Anion gap: 11 (ref 5–15)
BILIRUBIN TOTAL: 0.7 mg/dL (ref 0.3–1.2)
BUN: 11 mg/dL (ref 6–20)
CALCIUM: 9.6 mg/dL (ref 8.9–10.3)
CO2: 22 mmol/L (ref 22–32)
CREATININE: 0.88 mg/dL (ref 0.44–1.00)
Chloride: 106 mmol/L (ref 101–111)
GFR calc Af Amer: >60 mL/min (ref >60)
GLUCOSE: 102 mg/dL — AB (ref 65–99)
Potassium: 3.1 mmol/L — ABNORMAL LOW (ref 3.5–5.1)
Sodium: 139 mmol/L (ref 135–145)
TOTAL PROTEIN: 8.2 g/dL — AB (ref 6.5–8.1)

## 2016-04-15 LAB — CBC
HEMATOCRIT: 32.2 % — AB (ref 36.0–46.0)
Hemoglobin: 11 g/dL — ABNORMAL LOW (ref 12.0–15.0)
MCH: 29.4 pg (ref 26.0–34.0)
MCHC: 34.2 g/dL (ref 30.0–36.0)
MCV: 86.1 fL (ref 78.0–100.0)
Platelets: 291 10*3/uL (ref 150–400)
RBC: 3.74 MIL/uL — ABNORMAL LOW (ref 3.87–5.11)
RDW: 12.5 % (ref 11.5–15.5)
WBC: 4.4 10*3/uL (ref 4.0–10.5)

## 2016-04-15 LAB — SALICYLATE LEVEL: Salicylate Lvl: <7 mg/dL (ref 2.8–30.0)

## 2016-04-15 LAB — RAPID URINE DRUG SCREEN, HOSP PERFORMED
Amphetamines: NOT DETECTED
BARBITURATES: NOT DETECTED
BENZODIAZEPINES: NOT DETECTED
Cocaine: NOT DETECTED
Opiates: NOT DETECTED
Tetrahydrocannabinol: POSITIVE — AB

## 2016-04-15 LAB — I-STAT BETA HCG BLOOD, ED (MC, WL, AP ONLY)

## 2016-04-15 LAB — ETHANOL

## 2016-04-15 LAB — ACETAMINOPHEN LEVEL: Acetaminophen (Tylenol), Serum: <10 ug/mL — ABNORMAL LOW (ref 10–30)

## 2016-04-15 MED ORDER — STERILE WATER FOR INJECTION IJ SOLN
INTRAMUSCULAR | Status: AC
  Administered 2016-04-15: 10:00:00
  Filled 2016-04-15: qty 10

## 2016-04-15 MED ORDER — IBUPROFEN 200 MG PO TABS: 600.0000 mg | ORAL_TABLET | Freq: Three times a day (TID) | ORAL | Status: DC | PRN

## 2016-04-15 MED ORDER — ZIPRASIDONE MESYLATE 20 MG IM SOLR
10.0000 mg | Freq: Once | INTRAMUSCULAR | Status: AC
Start: 2016-04-15 — End: 2016-04-15
  Administered 2016-04-15: 10 mg via INTRAMUSCULAR
  Filled 2016-04-15: qty 20

## 2016-04-15 MED ORDER — ALUM & MAG HYDROXIDE-SIMETH 200-200-20 MG/5ML PO SUSP: 30.0000 mL | ORAL | Status: DC | PRN

## 2016-04-15 MED ORDER — ONDANSETRON HCL 4 MG PO TABS: 4.0000 mg | ORAL_TABLET | Freq: Three times a day (TID) | ORAL | Status: DC | PRN

## 2016-04-15 MED ORDER — POTASSIUM CHLORIDE CRYS ER 20 MEQ PO TBCR
40.0000 meq | EXTENDED_RELEASE_TABLET | Freq: Once | ORAL | Status: AC
  Administered 2016-04-15: 40 meq via ORAL
  Filled 2016-04-15: qty 2

## 2016-04-15 MED ORDER — LORAZEPAM 1 MG PO TABS: 1.0000 mg | ORAL_TABLET | Freq: Three times a day (TID) | ORAL | Status: DC | PRN

## 2016-04-15 MED ORDER — ACETAMINOPHEN 325 MG PO TABS: 650.0000 mg | ORAL_TABLET | ORAL | Status: DC | PRN
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
## 2016-04-15 NOTE — ED Notes (Signed)
Bed: ZO10 Expected date:  Expected time:  Means of arrival:  Comments: [employer]- IVC-Hallucinations

## 2016-04-15 NOTE — BH Assessment (Signed)
Contacted patients mother Mekisha Bittel [phone] who states that she feels that she visited the patient today who appears more stable. She states that the patient took cocaine earlier in the week and has not been to sleep since doing cocaine. She states that the patient has also not been eating. Patients mother states that she feels that the patient was "pretty normal" when she visited earlier today and states that she feels comfortable with the patient being discharged if she "remains stable like she was when I saw her earlier today.: She states that the patient "is pretty normal" and "she seems like she is stabilized" based on what she saw earlier. Patients mother states that she feels that the patient would benefit from outpatient therapy.   Davina Poke, LCSW Therapeutic Triage Specialist  Health 04/15/2016 7:24 PM

## 2016-04-15 NOTE — ED Notes (Signed)
SBAR Report received from previous nurse. Pt received calm and visible on unit. Pt denies current SI/ HI,  depression, anxiety, or pain at this time, pt endorses AH at this time but would not go into details and appears otherwise stable and free of distress. Pt reminded of camera surveillance, q 15 min rounds, and rules of the milieu. Will continue to assess.

## 2016-04-15 NOTE — ED Notes (Signed)
Family at bedside. 

## 2016-04-15 NOTE — ED Notes (Signed)
Pt resting, much calmer at present, plan is to move pt to [facility]

## 2016-04-15 NOTE — ED Notes (Signed)
Pt personal belongings searched and placed in labeled bags(2)...blk shirt, blk boots, blk coats, blk/pnk bra, blk/brwn/plaid purse. Blk knife removed and handed over to [employer].  Wallet, lighter(2) purse arm strap(2) found and left in bag per [employer]. Labeled bags placed in [employer]. [employer]

## 2016-04-15 NOTE — ED Notes (Signed)
Pt admitted to room #43. Pt behavior cooperative. Denies SI/HI. Pt reports "I hear my conscious loudly." Reports "I thought my neighbor had children under their house and under my house." Thought blocking noted. Encouragement and support provided.  Special checks q 15 mins in place for safety. Video monitoring in place. Will continue to monitor.

## 2016-04-15 NOTE — ED Notes (Signed)
Patient transported to X-ray 

## 2016-04-16 ENCOUNTER — Encounter (HOSPITAL_COMMUNITY): Payer: Self-pay | Admitting: Registered Nurse

## 2016-04-16 DIAGNOSIS — F23 Brief psychotic disorder: Secondary | ICD-10-CM | POA: Diagnosis not present

## 2016-04-16 DIAGNOSIS — F17210 Nicotine dependence, cigarettes, uncomplicated: Secondary | ICD-10-CM | POA: Diagnosis not present

## 2016-04-16 DIAGNOSIS — F12950 Cannabis use, unspecified with psychotic disorder with delusions: Secondary | ICD-10-CM

## 2016-04-16 DIAGNOSIS — Z79899 Other long term (current) drug therapy: Secondary | ICD-10-CM

## 2016-04-16 HISTORY — DX: Cannabis use, unspecified with psychotic disorder with delusions: F12.950

## 2016-04-16 NOTE — Progress Notes (Signed)
CSW filed patient's notice of commitment change paperwork into IVC logbook.   Bani Gianfrancesco, LCSWA [facility]  Clinical Social Worker [phone] 
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
## 2016-04-16 NOTE — [facility] Suicide Risk Assessment (Cosign Needed)
Suicide Risk Assessment  Discharge Assessment   [facility] Discharge Suicide Risk Assessment   Principal Problem: Cannabis-induced psychotic disorder with delusions [facility]) Discharge Diagnoses:  Patient Active Problem List   Diagnosis Date Noted  . Cannabis-induced psychotic disorder with delusions (HCC) [F12.950] 04/16/2016  . Acute psychosis [F23]   . Amphetamine abuse [F15.10] 06/30/2015  . Tobacco use disorder [F17.200] 06/30/2015  . 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 Cocaine use disorder, mild, abuse [F14.10] 03/10/2015  . Cannabis use disorder, severe, dependence (HCC) [F12.20] 03/10/2015    Total Time spent with patient: 30 minutes  Musculoskeletal: Strength & Muscle Tone: within normal limits Gait & Station: normal Patient leans: N/A  Psychiatric Specialty Exam: Physical Exam  Nursing note and vitals reviewed. Constitutional: She is oriented to person, place, and time.  Neck: Normal range of motion.  Respiratory: Effort normal.  Musculoskeletal: Normal range of motion.  Neurological: She is alert and oriented to person, place, and time.    Review of Systems  Psychiatric/Behavioral: Positive for substance abuse. Negative for depression, hallucinations (Denies at this time) and suicidal ideas. The patient is not nervous/anxious.   All other systems reviewed and are negative.   Blood pressure (!) 157/74, pulse 85, temperature 99 F (37.2 C), temperature source Oral, resp. rate 18, SpO2 100 %.There is no height or weight on file to calculate BMI.  General Appearance: Fairly Groomed  Eye Contact:  Good  Speech:  Clear and Coherent and Normal Rate  Volume:  Normal  Mood:  Euthymic  Affect:  Congruent  Thought Process:  Goal Directed  Orientation:  Full (Time, Place, and Person)  Thought Content:  WDL  Suicidal Thoughts:  No  Homicidal Thoughts:  No  Memory:  Immediate;   Fair Recent;   Fair Remote;   Fair  Judgement:  Fair  Insight:   Fair  Psychomotor Activity:  Normal  Concentration:  Concentration: Fair and Attention Span: Fair  Recall:  [employer] of Knowledge:  Fair  Language:  Fair  Akathisia:  No  Handed:  Right  AIMS (if indicated):     Assets:  Communication Skills Desire for Improvement  ADL's:  Intact  Cognition:  WNL  Sleep:       Mental Status Per Nursing Assessment::   On Admission:   Psychosis  Demographic Factors:  NA  Loss Factors: NA  Historical Factors: NA  Risk Reduction Factors:   Sense of responsibility to family, Positive social support and Positive therapeutic relationship  Continued Clinical Symptoms:  Alcohol/Substance Abuse/Dependencies  Cognitive Features That Contribute To Risk:  None    Suicide Risk:  Minimal: No identifiable suicidal ideation.  Patients presenting with no risk factors but with morbid ruminations; may be classified as minimal risk based on the severity of the depressive symptoms    Plan Of Care/Follow-up recommendations:  Activity:  As tolerated Diet:  Heart Healthy  Ireta Pullman, NP 04/16/2016, 1:42 PM

## 2016-04-16 NOTE — ED Notes (Signed)
Pt has been hallucinating all nigh making bizarre hand gestures, and talking to herself. Pt yelled out when a patient came to her room. After that pt has been afraid and yelled out another time when patient came near her room. Pt hypervigilant and afraid staring out her bedroom window. Pt has not slept more than 1 hour this shift.

## 2016-05-24 IMAGING — CT CT HEAD W/O CM
2 series · 16 of 30 positions shown, 20 images · non-contrast
Comparison: None.

CLINICAL DATA: Abnormal behavior

EXAM:
CT HEAD WITHOUT CONTRAST
TECHNIQUE: Contiguous axial images were obtained from the base of the skull
through the vertex without intravenous contrast.

[Series 2: head w/o · axial · non-contrast · 0.45mm/px · z∈[-182,-72]mm · 13 of 26 slices shown, 17 images]
[im 2/26  brain]
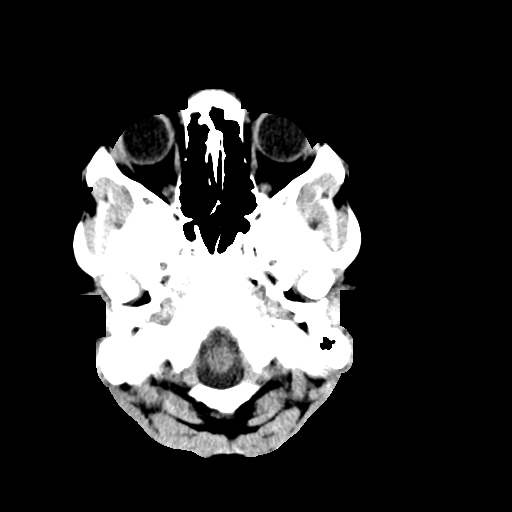
[im 2/26  bone]
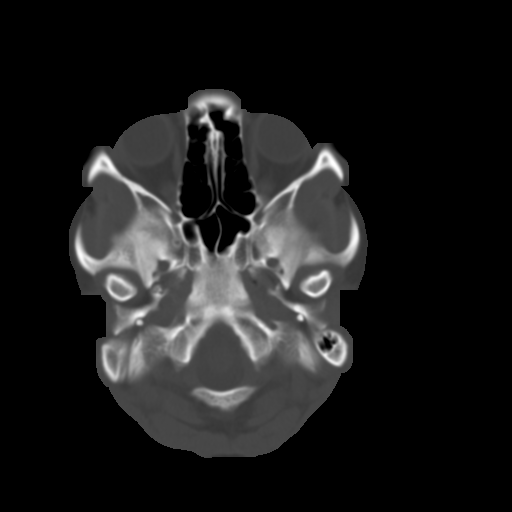
[im 4/26  brain]
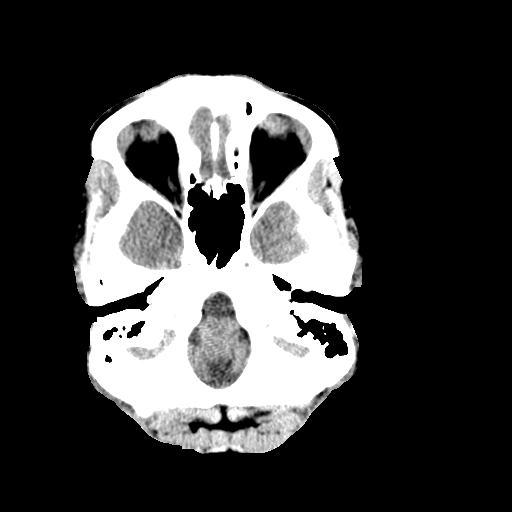
[im 6/26  brain]
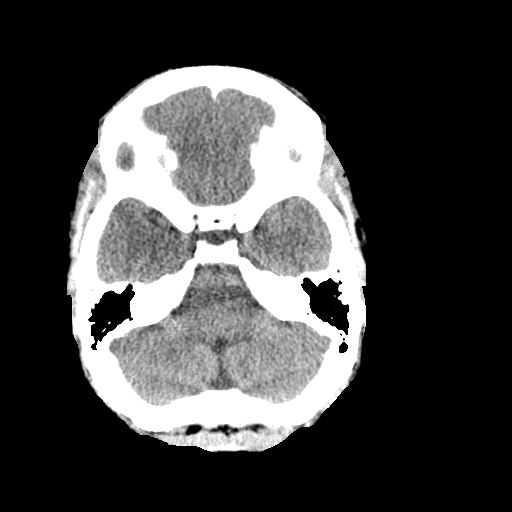
[im 8/26  brain]
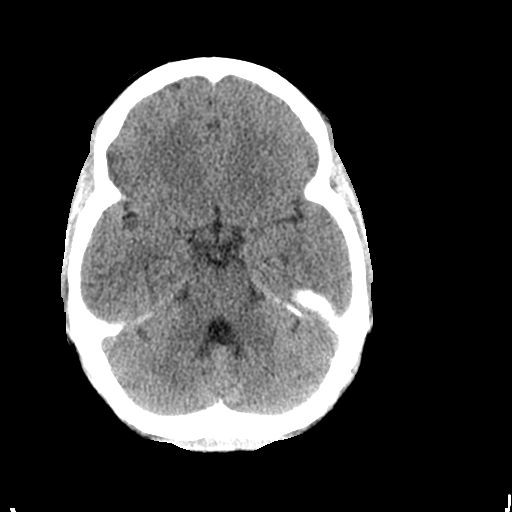
[im 9/26  brain]
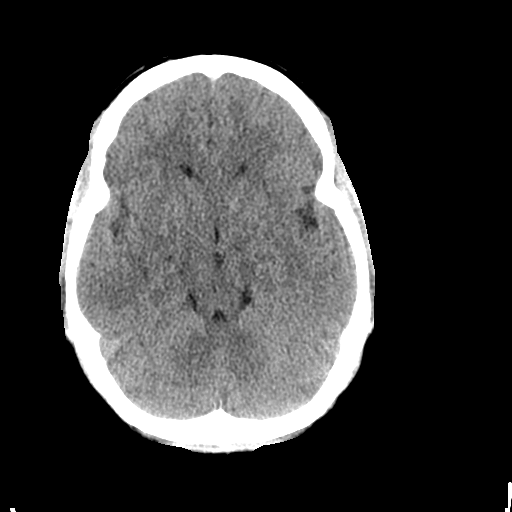
[im 9/26  bone]
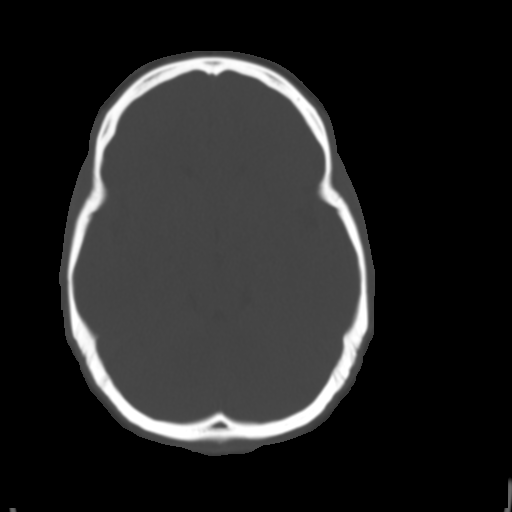
[im 11/26  brain]
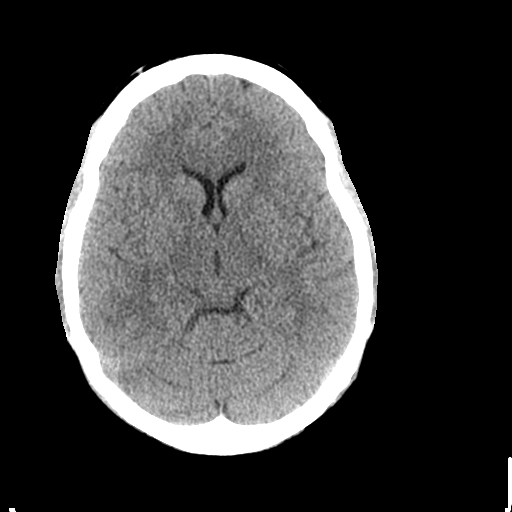
[im 13/26  brain]
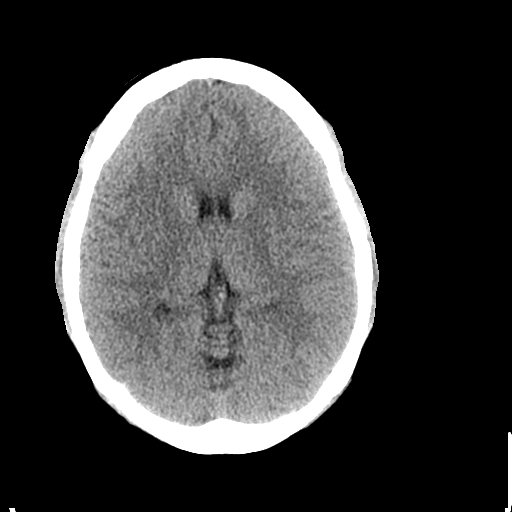
[im 15/26  brain]
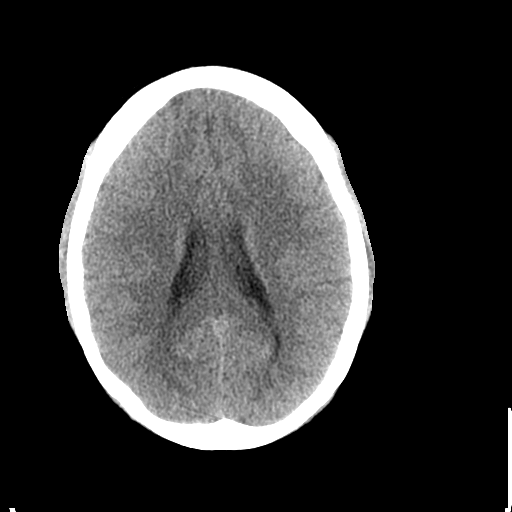
[im 17/26  brain]
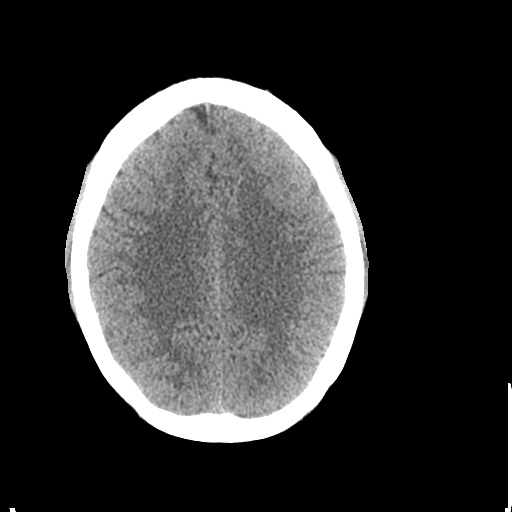
[im 17/26  bone]
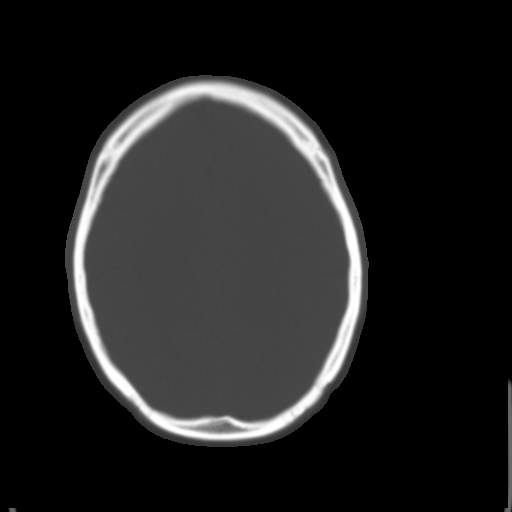
[im 18/26  brain]
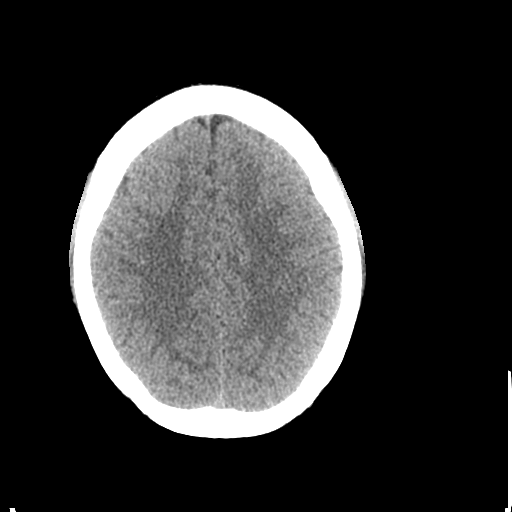
[im 20/26  brain]
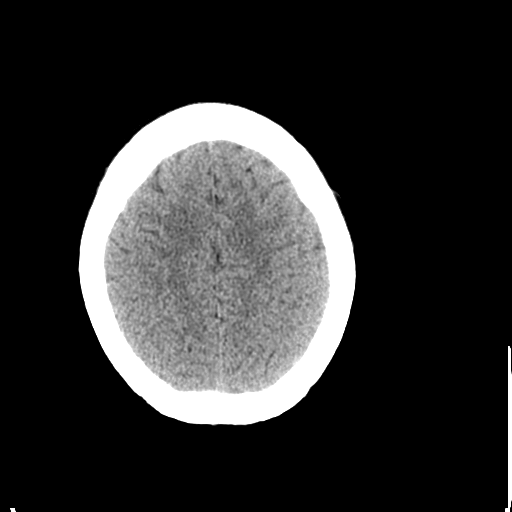
[im 22/26  brain]
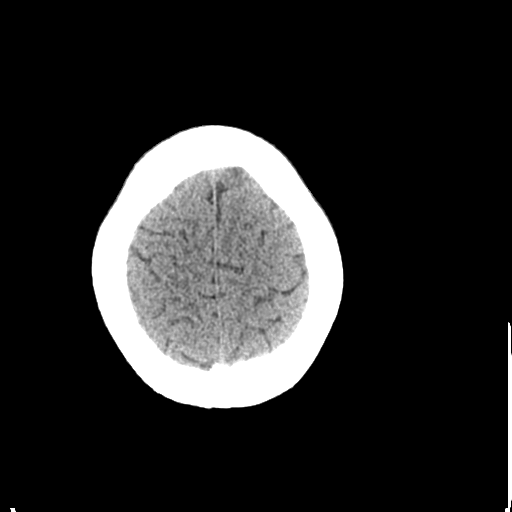
[im 24/26  brain]
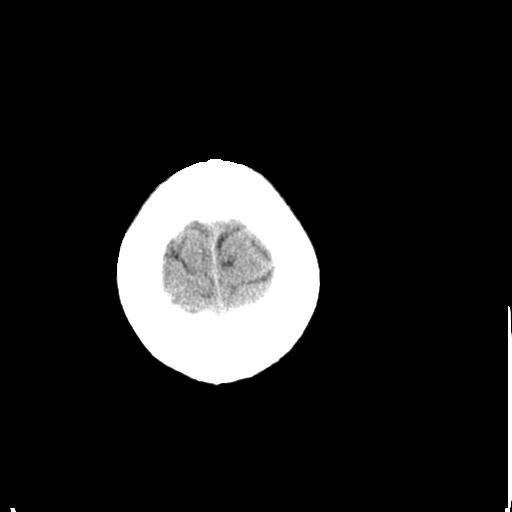
[im 24/26  bone]
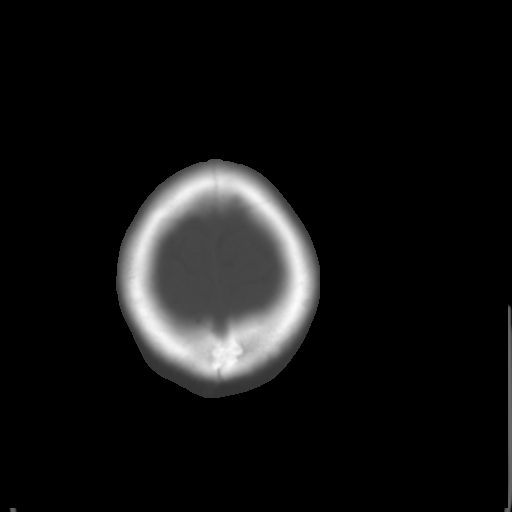

[Series 3: bone windows · axial · 0.45mm/px · z∈[-182,-147]mm · 3 of 26 slices shown]
[im 2/26  bone]
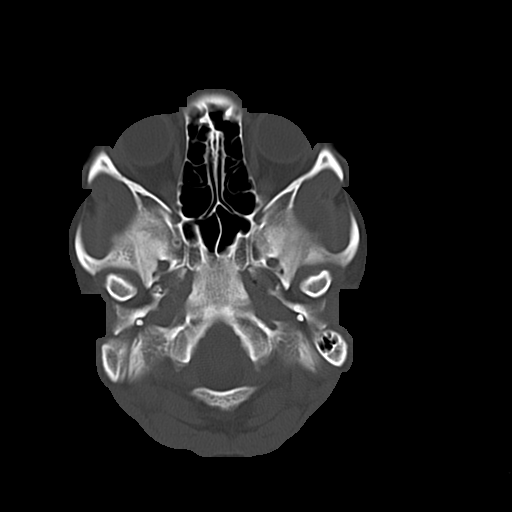
[im 6/26  bone]
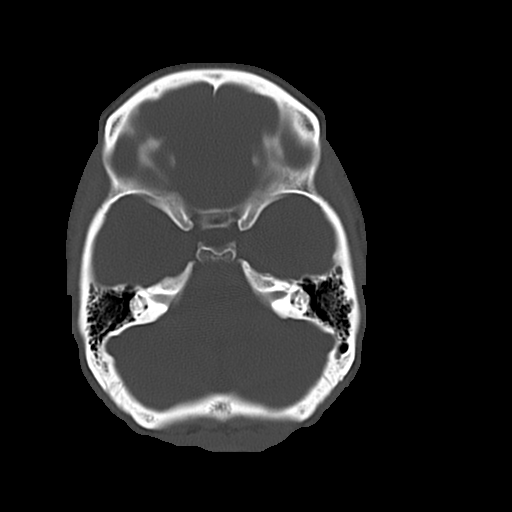
[im 9/26  bone]
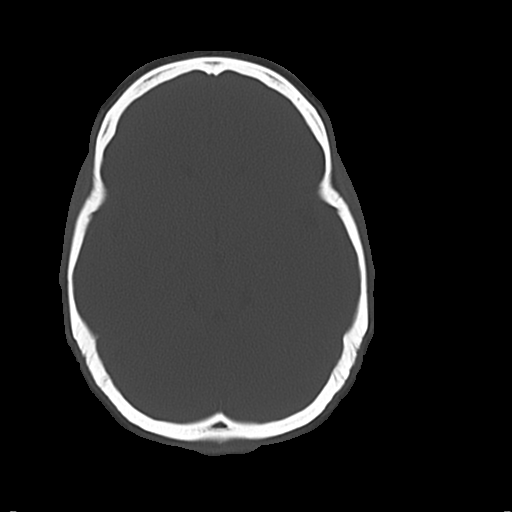

[16 of 30 positions shown; findings below may reference images not displayed]

FINDINGS: No skull fracture is noted. Paranasal sinuses and mastoid air cells
are unremarkable. No hydrocephalus. No intracranial hemorrhage, mass
effect or midline shift. No acute cortical infarction. No mass
lesion is noted on this unenhanced scan. No intra or extra-axial
fluid collection.
IMPRESSION: No acute intracranial abnormality.
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
## 2019-08-01 ENCOUNTER — Other Ambulatory Visit: Payer: Self-pay

## 2019-08-01 ENCOUNTER — Encounter (HOSPITAL_COMMUNITY): Payer: Self-pay

## 2019-08-01 ENCOUNTER — Emergency Department (HOSPITAL_COMMUNITY)
Admission: EM | Admit: 2019-08-01 | Discharge: 2019-08-01 | Disposition: A | Discharge status: Home / Self Care | Payer: Self-pay | Attending: Emergency Medicine | Admitting: Emergency Medicine

## 2019-08-01 DIAGNOSIS — J069 Acute upper respiratory infection, unspecified: Secondary | ICD-10-CM | POA: Insufficient documentation

## 2019-08-01 DIAGNOSIS — Z20822 Contact with and (suspected) exposure to covid-19: Secondary | ICD-10-CM | POA: Insufficient documentation

## 2019-08-01 DIAGNOSIS — F17210 Nicotine dependence, cigarettes, uncomplicated: Secondary | ICD-10-CM | POA: Insufficient documentation

## 2019-08-01 DIAGNOSIS — R519 Headache, unspecified: Secondary | ICD-10-CM | POA: Insufficient documentation

## 2019-08-01 LAB — SARS CORONAVIRUS 2 BY RT PCR (HOSPITAL ORDER, PERFORMED IN ~~LOC~~ HOSPITAL LAB): SARS Coronavirus 2: NEGATIVE

## 2019-08-01 NOTE — Discharge Instructions (Signed)
Return to the ER if you develop any trouble breathing

## 2019-08-01 NOTE — ED Triage Notes (Signed)
Pt arrives to ED w/ c/o cough and headache that started Tuesday. Pt states she has not received covid vaccines. Pt denies fever, sob.

## 2019-08-01 NOTE — ED Provider Notes (Signed)
[facility] EMERGENCY DEPARTMENT Provider Note   CSN: [id number] Arrival date & time: 08/01/19  1547     History Chief Complaint  Patient presents with  . Cough    Brenda Hamilton is a 29 y.o. female.  The history is provided by the patient.  Cough Cough characteristics:  Non-productive Severity:  Moderate Onset quality:  Sudden Duration:  4 days Timing:  Constant Progression:  Improving Chronicity:  New Smoker: yes   Context: upper respiratory infection   Relieved by:  Fluids and cough suppressants Worsened by:  Nothing Ineffective treatments:  None tried Associated symptoms: fever, headaches and rhinorrhea   Associated symptoms: no chest pain, no chills, no ear pain, no eye discharge, no rash, no shortness of breath and no wheezing   Risk factors comment:  Has not been vaccinated against Covid and no known Covid contacts      Past Medical History:  Diagnosis Date  . Anxiety   . Anxiety disorder   . Cannabis dependence, continuous (HCC)   . 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 Cocaine abuse, continuous [facility])     Patient Active Problem List   Diagnosis Date Noted  . 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 Acute psychosis (HCC)   . 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 Tobacco use disorder 06/30/2015  . 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 Cocaine use disorder, mild, abuse (HCC) 03/10/2015  . Cannabis use disorder, severe, dependence (HCC) 03/10/2015    Past Surgical History:  Procedure Laterality Date  . NO PAST SURGERIES       OB History    Gravida  1   Para      Term      Preterm      AB  1   Living        SAB  1   TAB      Ectopic      Multiple      Live Births              Family History  Problem Relation Age of Onset  . Diabetes Mother   . Other Neg Hx     Social History    Tobacco Use  . Smoking status: Current Some Day Smoker    Packs/day: 0.25    Years: 4.00    Pack years: 1.00    Types: Cigarettes  . Smokeless tobacco: Never Used  Substance Use Topics  . Alcohol use: Yes    Alcohol/week: 0.0 standard drinks    Comment: occasional (once a month or less)  . Drug use: Yes    Types: Marijuana, MDMA (Ecstacy), Cocaine    Comment: Marijuana one week ago    Home Medications Prior to Admission medications   Medication Sig Start Date End Date Taking? Authorizing Provider  OLANZapine (ZYPREXA) 15 MG tablet Take 1 tablet (15 mg total) by mouth at bedtime. Patient not taking: Reported on 04/15/2016 07/01/15   Jimmy Footman, MD    Allergies    Patient has no known allergies.  Review of Systems   Review of Systems  Constitutional: Positive for fever. Negative for chills.  HENT: Positive for rhinorrhea. Negative for ear pain.   Eyes: Negative for discharge.  Respiratory: Positive for cough. Negative for shortness of breath and wheezing.   Cardiovascular: Negative for chest pain.  Skin: Negative for rash.  Neurological: Positive for headaches.  All other systems  reviewed and are negative.   Physical Exam Updated Vital Signs BP 118/73   Pulse 94   Temp 98.4 F (36.9 C) (Oral)   Resp 18   SpO2 99%   Physical Exam Vitals and nursing note reviewed.  Constitutional:      General: She is not in acute distress.    Appearance: She is well-developed and normal weight.  HENT:     Head: Normocephalic and atraumatic.     Right Ear: Tympanic membrane normal.     Left Ear: Tympanic membrane normal.     Nose: Nose normal.     Mouth/Throat:     Comments: Bilateral pharyngeal erythema without tonsillar exudates Eyes:     Conjunctiva/sclera: Conjunctivae normal.     Pupils: Pupils are equal, round, and reactive to light.  Cardiovascular:     Rate and Rhythm: Normal rate and regular rhythm.     Pulses: Normal pulses.     Heart sounds: No  murmur heard.   Pulmonary:     Effort: Pulmonary effort is normal. No respiratory distress.     Breath sounds: Normal breath sounds. No wheezing or rales.  Musculoskeletal:        General: No tenderness. Normal range of motion.     Cervical back: Normal range of motion and neck supple.     Right lower leg: No edema.     Left lower leg: No edema.  Skin:    General: Skin is warm and dry.     Findings: No erythema or rash.  Neurological:     General: No focal deficit present.     Mental Status: She is alert and oriented to person, place, and time. Mental status is at baseline.  Psychiatric:        Mood and Affect: Mood normal.        Behavior: Behavior normal.        Thought Content: Thought content normal.     ED Results / Procedures / Treatments   Labs (all labs ordered are listed, but only abnormal results are displayed) Labs Reviewed  SARS CORONAVIRUS 2 BY RT PCR (HOSPITAL ORDER, PERFORMED IN [facility])    EKG None  Radiology No results found.  Procedures Procedures (including critical care time)  Medications Ordered in ED Medications - No data to display  ED Course  I have reviewed the triage vital signs and the nursing notes.  Pertinent labs & imaging results that were available during my care of the patient were reviewed by me and considered in my medical decision making (see chart for details).    MDM Rules/Calculators/A&P                          Pt with symptoms consistent with viral URI vs COVID.  Well appearing here.  No signs of breathing difficulty  No signs of pharyngitis, otitis or abnormal abdominal findings. Sats 99% on RA and normal resp rate.    Has not been vaccinated and COVID sent.  Stable for d/c.  Brenda Hamilton was evaluated in Emergency Department on 08/01/2019 for the symptoms described in the history of present illness. She was evaluated in the context of the global COVID-19 pandemic, which necessitated consideration that  the patient might be at risk for infection with the SARS-CoV-2 virus that causes COVID-19. Institutional protocols and algorithms that pertain to the evaluation of patients at risk for COVID-19 are in a state of rapid change  based on information released by regulatory bodies including the CDC and federal and state organizations. These policies and algorithms were followed during the patient's care in the ED.   Final Clinical Impression(s) / ED Diagnoses Final diagnoses:  Viral URI with cough    Rx / DC Orders ED Discharge Orders    None       Gwyneth Sprout, MD 08/01/19 1700

## 2019-08-07 ENCOUNTER — Emergency Department (HOSPITAL_COMMUNITY)
Admission: EM | Admit: 2019-08-07 | Discharge: 2019-08-09 | Disposition: A | Discharge status: Other Acute Inpatient Hospital | Payer: Self-pay | Attending: Emergency Medicine | Admitting: Emergency Medicine

## 2019-08-07 ENCOUNTER — Encounter (HOSPITAL_COMMUNITY): Payer: Self-pay | Admitting: Obstetrics and Gynecology

## 2019-08-07 ENCOUNTER — Other Ambulatory Visit: Payer: Self-pay

## 2019-08-07 DIAGNOSIS — Z046 Encounter for general psychiatric examination, requested by authority: Secondary | ICD-10-CM | POA: Insufficient documentation

## 2019-08-07 DIAGNOSIS — F309 Manic episode, unspecified: Secondary | ICD-10-CM | POA: Insufficient documentation

## 2019-08-07 DIAGNOSIS — F12250 Cannabis dependence with psychotic disorder with delusions: Secondary | ICD-10-CM | POA: Insufficient documentation

## 2019-08-07 DIAGNOSIS — F3010 Manic episode without psychotic symptoms, unspecified: Secondary | ICD-10-CM | POA: Insufficient documentation

## 2019-08-07 DIAGNOSIS — F17210 Nicotine dependence, cigarettes, uncomplicated: Secondary | ICD-10-CM | POA: Insufficient documentation

## 2019-08-07 DIAGNOSIS — Z20822 Contact with and (suspected) exposure to covid-19: Secondary | ICD-10-CM | POA: Insufficient documentation

## 2019-08-07 LAB — RAPID URINE DRUG SCREEN, HOSP PERFORMED
Amphetamines: NOT DETECTED
Barbiturates: NOT DETECTED
Benzodiazepines: NOT DETECTED
Cocaine: NOT DETECTED
Opiates: NOT DETECTED
Tetrahydrocannabinol: POSITIVE — AB

## 2019-08-07 LAB — CBC WITH DIFFERENTIAL/PLATELET
Abs Immature Granulocytes: 0.02 10*3/uL (ref 0.00–0.07)
Basophils Absolute: 0 10*3/uL (ref 0.0–0.1)
Basophils Relative: 1 %
Eosinophils Absolute: 0 10*3/uL (ref 0.0–0.5)
Eosinophils Relative: 0 %
HCT: 36.8 % (ref 36.0–46.0)
Hemoglobin: 11.9 g/dL — ABNORMAL LOW (ref 12.0–15.0)
Immature Granulocytes: 0 %
Lymphocytes Relative: 28 %
Lymphs Abs: 1.7 10*3/uL (ref 0.7–4.0)
MCH: 29.2 pg (ref 26.0–34.0)
MCHC: 32.3 g/dL (ref 30.0–36.0)
MCV: 90.2 fL (ref 80.0–100.0)
Monocytes Absolute: 0.5 10*3/uL (ref 0.1–1.0)
Monocytes Relative: 8 %
Neutro Abs: 3.8 10*3/uL (ref 1.7–7.7)
Neutrophils Relative %: 63 %
Platelets: 287 10*3/uL (ref 150–400)
RBC: 4.08 MIL/uL (ref 3.87–5.11)
RDW: 12.6 % (ref 11.5–15.5)
WBC: 6 10*3/uL (ref 4.0–10.5)
nRBC: 0 % (ref 0.0–0.2)

## 2019-08-07 LAB — URINALYSIS, ROUTINE W REFLEX MICROSCOPIC
Bilirubin Urine: NEGATIVE
Glucose, UA: NEGATIVE mg/dL
Hgb urine dipstick: NEGATIVE
Ketones, ur: NEGATIVE mg/dL
Leukocytes,Ua: NEGATIVE
Nitrite: NEGATIVE
Protein, ur: 100 mg/dL — AB
Specific Gravity, Urine: 1.028 (ref 1.005–1.030)
pH: 5 (ref 5.0–8.0)

## 2019-08-07 LAB — COMPREHENSIVE METABOLIC PANEL
ALT: 24 U/L (ref 0–44)
AST: 18 U/L (ref 15–41)
Albumin: 5 g/dL (ref 3.5–5.0)
Alkaline Phosphatase: 49 U/L (ref 38–126)
Anion gap: 12 (ref 5–15)
BUN: 10 mg/dL (ref 6–20)
CO2: 25 mmol/L (ref 22–32)
Calcium: 9.8 mg/dL (ref 8.9–10.3)
Chloride: 101 mmol/L (ref 98–111)
Creatinine, Ser: 0.75 mg/dL (ref 0.44–1.00)
GFR calc Af Amer: >60 mL/min (ref >60)
GFR calc non Af Amer: >60 mL/min (ref >60)
Glucose, Bld: 118 mg/dL — ABNORMAL HIGH (ref 70–99)
Potassium: 3.4 mmol/L — ABNORMAL LOW (ref 3.5–5.1)
Sodium: 138 mmol/L (ref 135–145)
Total Bilirubin: 0.5 mg/dL (ref 0.3–1.2)
Total Protein: 8.5 g/dL — ABNORMAL HIGH (ref 6.5–8.1)

## 2019-08-07 LAB — I-STAT BETA HCG BLOOD, ED (MC, WL, AP ONLY): I-stat hCG, quantitative: <5 m[IU]/mL (ref <5)

## 2019-08-07 LAB — ETHANOL: Alcohol, Ethyl (B): <10 mg/dL (ref <10)

## 2019-08-07 LAB — SARS CORONAVIRUS 2 BY RT PCR (HOSPITAL ORDER, PERFORMED IN ~~LOC~~ HOSPITAL LAB): SARS Coronavirus 2: NEGATIVE

## 2019-08-07 MED ORDER — ACETAMINOPHEN 325 MG PO TABS
650.0000 mg | ORAL_TABLET | Freq: Once | ORAL | Status: AC
  Administered 2019-08-07: 650 mg via ORAL
  Filled 2019-08-07: qty 2

## 2019-08-07 MED ORDER — LORAZEPAM 1 MG PO TABS
2.0000 mg | ORAL_TABLET | Freq: Once | ORAL | Status: AC
  Administered 2019-08-07: 2 mg via ORAL
  Filled 2019-08-07: qty 2

## 2019-08-07 MED ORDER — ONDANSETRON 4 MG PO TBDP
4.0000 mg | ORAL_TABLET | Freq: Once | ORAL | Status: AC
  Administered 2019-08-07: 4 mg via ORAL
  Filled 2019-08-07: qty 1

## 2019-08-07 NOTE — [facility] Counselor (Signed)
TTS attempted to complete assessment with patient, pt uncooperative during assessment, would not respond verbally to any questions, pt presented preoccupied and not oriented enough to complete assessment. TTS to attempt assessment again at a later time.
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
## 2019-08-07 NOTE — ED Notes (Signed)
Pt in obvious distress stating that she is tired of waiting and this is over old stuff. Provider updated, and ativan requested.

## 2019-08-07 NOTE — ED Notes (Signed)
Call received from pt mother Andrian Urbach [phone] requesting rtn call for pt status/updates. RN advised. [employer]

## 2019-08-07 NOTE — ED Notes (Signed)
Patient given lunch tray.

## 2019-08-07 NOTE — ED Notes (Signed)
Pt tearful in hallway. When asked why she was upset pt stated that she needs to go. I explained that we are still waiting for the recommendation from TTS. Stated that she understood. When asked if there was anything else we can do to make her more comfortable while she waits pt again stated she needed to leave.

## 2019-08-07 NOTE — ED Provider Notes (Signed)
[facility]-EMERGENCY DEPT Provider Note   CSN: [id number] Arrival date & time: 08/07/19  1056     History Chief Complaint  Patient presents with  . IVC  . Psychiatric Evaluation    Traci Gafford is a 29 y.o. female.  29 y.o female with a PMH of cocaine abuse, amphetamine abuse, anxiety presents to the ED under the custody of [employer] under IVC by mother.  According to mother, patient has been noncompliant with medication, she has been acting "manic ", patient states "my neighbors are harassing me ", she rambles around during interview.  She also states "Rhetta Mura is in the back of my house ".  According to her records she was previously placed on Zyprexa in 2018, unknown if she has been taking this medication.  She has not complained of any chest pain, shortness of breath, abdominal pain or other complaints.  The history is provided by the patient and medical records.       Past Medical History:  Diagnosis Date  . Anxiety   . Anxiety disorder   . Cannabis dependence, continuous (HCC)   . 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 Cocaine abuse, continuous [facility])     Patient Active Problem List   Diagnosis Date Noted  . 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 Acute psychosis (HCC)   . 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 Tobacco use disorder 06/30/2015  . 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 Cocaine use disorder, mild, abuse (HCC) 03/10/2015  . Cannabis use disorder, severe, dependence (HCC) 03/10/2015    Past Surgical History:  Procedure Laterality Date  . NO PAST SURGERIES       OB History    Gravida  1   Para      Term      Preterm      AB  1   Living        SAB  1   TAB      Ectopic      Multiple      Live Births              Family History  Problem Relation Age of Onset  . Diabetes Mother   .  Other Neg Hx     Social History   Tobacco Use  . Smoking status: Current Some Day Smoker    Packs/day: 0.25    Years: 4.00    Pack years: 1.00    Types: Cigarettes  . Smokeless tobacco: Never Used  Substance Use Topics  . Alcohol use: Yes    Alcohol/week: 0.0 standard drinks    Comment: occasional (once a month or less)  . Drug use: Yes    Types: Marijuana, MDMA (Ecstacy), Cocaine    Comment: Marijuana one week ago    Home Medications Prior to Admission medications   Medication Sig Start Date End Date Taking? Authorizing Provider  OLANZapine (ZYPREXA) 15 MG tablet Take 1 tablet (15 mg total) by mouth at bedtime. Patient not taking: Reported on 04/15/2016 07/01/15   Jimmy Footman, MD    Allergies    Patient has no known allergies.  Review of Systems   Review of Systems  Constitutional: Negative for chills and fever.  HENT: Positive for sore throat.   Respiratory: Negative for shortness of breath.   Cardiovascular: Negative for chest pain.  Gastrointestinal: Negative for abdominal pain.  Genitourinary: Negative for flank pain.  Musculoskeletal: Negative for back pain.  Neurological: Negative for light-headedness and headaches.  All other systems reviewed and are negative.   Physical Exam Updated Vital Signs BP (!) 126/97 (BP Location: Left Arm)   Pulse 102   Temp 98.7 F (37.1 C) (Oral)   Resp 18   Ht 5\' 4"  (1.626 m)   Wt 64 kg   LMP 07/24/2019 (Approximate)   SpO2 100%   BMI 24.22 kg/m   Physical Exam Vitals and nursing note reviewed.  Constitutional:      Appearance: She is not ill-appearing.  HENT:     Head: Normocephalic and atraumatic.     Nose: Nose normal.     Mouth/Throat:     Mouth: Mucous membranes are moist.     Pharynx: No oropharyngeal exudate.  Eyes:     Pupils: Pupils are equal, round, and reactive to light.  Cardiovascular:     Rate and Rhythm: Normal rate.  Pulmonary:     Effort: Pulmonary effort is normal.     Breath  sounds: No wheezing.  Abdominal:     General: Abdomen is flat.     Tenderness: There is no abdominal tenderness.  Musculoskeletal:     Cervical back: Normal range of motion and neck supple.  Skin:    General: Skin is warm and dry.  Neurological:     Mental Status: She is alert and oriented to person, place, and time.  Psychiatric:        Attention and Perception: She does not perceive auditory hallucinations.        Mood and Affect: Mood is anxious.        Speech: Speech is rapid and pressured.        Behavior: Behavior is hyperactive.        Thought Content: Thought content is paranoid. Thought content does not include homicidal or suicidal ideation. Thought content does not include homicidal or suicidal plan.     ED Results / Procedures / Treatments   Labs (all labs ordered are listed, but only abnormal results are displayed) Labs Reviewed  COMPREHENSIVE METABOLIC PANEL - Abnormal; Notable for the following components:      Result Value   Potassium 3.4 (*)    Glucose, Bld 118 (*)    Total Protein 8.5 (*)    All other components within normal limits  RAPID URINE DRUG SCREEN, HOSP PERFORMED - Abnormal; Notable for the following components:   Tetrahydrocannabinol POSITIVE (*)    All other components within normal limits  CBC WITH DIFFERENTIAL/PLATELET - Abnormal; Notable for the following components:   Hemoglobin 11.9 (*)    All other components within normal limits  URINALYSIS, ROUTINE W REFLEX MICROSCOPIC - Abnormal; Notable for the following components:   Protein, ur 100 (*)    Bacteria, UA RARE (*)    All other components within normal limits  SARS CORONAVIRUS 2 BY RT PCR (HOSPITAL ORDER, PERFORMED IN [facility])  ETHANOL  I-STAT BETA HCG BLOOD, ED ([facility], [facility], [facility] ONLY)    EKG None  Radiology No results found.  Procedures Procedures (including critical care time)  Medications Ordered in ED Medications  ondansetron (ZOFRAN-ODT) disintegrating tablet  4 mg (4 mg Oral Given 08/07/19 1318)    ED Course  I have reviewed the triage vital signs and the nursing notes.  Pertinent labs & imaging results that were available during my care of the patient were reviewed by me and considered in my medical decision making (see  chart for details).  Clinical Course as of Aug 06 1401  Thu Aug 07, 2019  1306 Tetrahydrocannabinol(!): POSITIVE [JS]  1306 Bacteria, UA(!): RARE [JS]    Clinical Course User Index [JS] Claude Manges, PA-C   MDM Rules/Calculators/A&P     Patient with a PMH of anxiety presents to the ED under IVC, patient was placed under IVC by mother she presents to the ED with [employer] present.  Patient has not been taking medication, has not been sleeping in 4 days, according to mother she is a danger to herself and others.  First exam was conducted on 08/07/2019 at 11:13 AM.  She does not have any complaint such as chest pain, shortness of breath, abdominal pain.  When asked about reason for being here patient states "I really do not know, somebody went to take papers on me ", "Rhetta Mura lives behind my house ", her speech is rapid and pressured, she appears to be manic at this time.  Vitals are within normal limits aside from slight elevation of her heart rate in the 102's.  Interpretation of her labs revealed a CBC without leukocytosis, slight decrease in hemoglobin but stable at this time.  CMP with mild hypokalemia, creatinine was within normal limits.  LFTs are unremarkable.  Denies any abdominal pain on today's visit.  UDS is positive for THC, with rare bacteria does not have any urinary symptoms at this time.  Alcohol and ethanol level are within normal limits.  Vitals are within normal limits, patient appears in stable condition and medically clear for psychiatric evaluation.   Portions of this note were generated with Scientist, clinical (histocompatibility and immunogenetics). Dictation errors may occur despite best attempts at proofreading.  Final Clinical  Impression(s) / ED Diagnoses Final diagnoses:  Involuntary commitment  Manic behavior [facility])    Rx / DC Orders ED Discharge Orders    None       Claude Manges, PA-C 08/12/19 2315    Bethann Berkshire, MD 08/22/19 [phone]

## 2019-08-08 MED ORDER — ZIPRASIDONE MESYLATE 20 MG IM SOLR
INTRAMUSCULAR | Status: AC
  Administered 2019-08-08: 20 mg via INTRAMUSCULAR
  Filled 2019-08-08: qty 20

## 2019-08-08 MED ORDER — ACETAMINOPHEN 325 MG PO TABS
650.0000 mg | ORAL_TABLET | Freq: Once | ORAL | Status: AC
  Administered 2019-08-08: 650 mg via ORAL
  Filled 2019-08-08: qty 2

## 2019-08-08 MED ORDER — STERILE WATER FOR INJECTION IJ SOLN
INTRAMUSCULAR | Status: AC
  Administered 2019-08-09: 1.2 mL
  Filled 2019-08-08: qty 10

## 2019-08-08 MED ORDER — ZIPRASIDONE MESYLATE 20 MG IM SOLR: 20.0000 mg | Freq: Once | INTRAMUSCULAR | Status: AC

## 2019-08-08 NOTE — BH Assessment (Signed)
[facility] Assessment Progress Note  Per Nelly Rout, MD, this pt requires psychiatric hospitalization at this time.  Pt presents under IVC initiated by pt's mother and upheld by EDP Bethann Berkshire, MD.  The following facilities have been contacted to seek placement for this pt, with results as noted:  Beds available, information sent, decision pending: [facility]   At capacity: [facility]   Doylene Canning, [place] [facility] Coordinator [phone]

## 2019-08-08 NOTE — BH Assessment (Signed)
Case was staffed with Lucianne Muss MD who recommended a inpatient admission to assist with stabilization.

## 2019-08-08 NOTE — ED Notes (Signed)
Patient Keeps getting out of bed, being disruptive, told multiple times to stay away from multiple patient rooms, Md contacted and meds ordered for patient.

## 2019-08-08 NOTE — BH Assessment (Signed)
Comprehensive Clinical Assessment (CCA) Screening, Triage and Referral Note  08/08/2019 Brenda Hamilton [id number]  Patient presents this date with IVC for ongoing delusions. Per IVC: Respondent states demons are on her and that Noemi Chapel was in her car trying to stab her. She has abandoned her car because she thinks there is a bomb under it. She thinks her brothers are dead and they were killed by Noemi Chapel. Thinks demons are out to kill her. Respondent has not slept in several days. Has been diagnosed with drug induced psychosis. Patient is observed to be very disorganized and renders limited history this date. Patient is hyper verbal with pressured speech and tangential. Patient denies any history of mental illness or current symptoms. Patient denies having a current OP provider or being prescribed any medications for symptom management. Patient denies any SA history. Patient is difficult to redirect and speaks at length about "Noemi Chapel trying to kill her." Patient denies any S/I, H/I or AVH although is stating "no,no,no" to every question. Information to complete assessment was obtained from admission notes and history.   Per notes this date Villisca PA writes on 7/22. Patient has a history of cocaine abuse, amphetamine abuse, anxiety presents to the ED under the custody of [employer] under IVC by mother. According to mother, patient has been noncompliant with medication, she has been acting "manic ", patient states "my neighbors are harassing me ", she rambles around during interview.  She also states "Rhetta Mura is in the back of my house ".  According to her records she was previously placed on Zyprexa in 2018, unknown if she has been taking this medication. She has not complained of any chest pain, shortness of breath, abdominal pain or other complaints. Patient was last seen on 04/15/16 when she presented with similar symptoms. Per that note patient denied any previous history of S/I or H/I.  Patient reported a SA history at that time. Patient's UDS was positive for THC this date.   Patient would not answer any orientation questions and rendered limit history on arrival. Patient is tangential and difficult to redirect. Patient is disorganized and is delusional. Patient does not appear to be responding to internal stimuli at the time of assessment. Case was staffed with Lucianne Muss MD who recommended a inpatient admission to assist with stabilization.      Visit Diagnosis: Unspecified psychosis   ICD-10-CM   1. Involuntary commitment  Z04.6   2. Manic behavior (HCC)  F30.10     Patient Reported Information How did you hear about [place]? Other (Comment) (Pt is with IVC)   Referral name: No data recorded  Referral phone number: No data recorded Whom do you see for routine medical problems? I don't have a doctor   Practice/Facility Name: No data recorded  Practice/Facility Phone Number: No data recorded  Name of Contact: No data recorded  Contact Number: No data recorded  Contact Fax Number: No data recorded  Prescriber Name: No data recorded  Prescriber Address (if known): No data recorded What Is the Reason for Your Visit/Call Today? Pt is with IVC this date  How Long Has This Been Causing You Problems? 1 wk - 1 month  Have You Recently Been in Any Inpatient Treatment (Hospital/Detox/Crisis Center/28-Day Program)? No   Name/Location of Program/Hospital:No data recorded  How Long Were You There? No data recorded  When Were You Discharged? No data recorded Have You Ever Received Services From [facility] Before? Yes   Who Do You See at  [place]? Pt has had multiple assessments in the past  Have You Recently Had Any Thoughts About Hurting Yourself? No   Are You Planning to Commit Suicide/Harm Yourself At This time?  No  Have you Recently Had Thoughts About Hurting Someone Karolee Ohs? No   Explanation: No data recorded Have You Used Any Alcohol or Drugs in the Past 24 Hours?  No   How Long Ago Did You Use Drugs or Alcohol?  No data recorded  What Did You Use and How Much? No data recorded What Do You Feel Would Help You the Most Today? Other (Comment) (UTA)  Do You Currently Have a Therapist/Psychiatrist? No   Name of Therapist/Psychiatrist: No data recorded  Have You Been Recently Discharged From Any Office Practice or Programs? No   Explanation of Discharge From Practice/Program:  No data recorded    CCA Screening Triage Referral Assessment Type of Contact: Face-to-Face   Is this Initial or Reassessment? Initial Assessment   Date Telepsych consult ordered in [facility]:  08/08/19   Time Telepsych consult ordered in [facility]:  2050  Patient Reported Information Reviewed? Yes   Patient Left Without Being Seen? No data recorded  Reason for Not Completing Assessment: No data recorded Collateral Involvement: None at this time  Does Patient Have a Court Appointed Legal Guardian? No data recorded  Name and Contact of Legal Guardian:  No data recorded If Minor and Not Living with Parent(s), Who has Custody? No data recorded Is CPS involved or ever been involved? Never  Is APS involved or ever been involved? Never  Patient Determined To Be At Risk for Harm To Self or Others Based on Review of Patient Reported Information or Presenting Complaint? No   Method: No data recorded  Availability of Means: No data recorded  Intent: No data recorded  Notification Required: No data recorded  Additional Information for Danger to Others Potential:  No data recorded  Additional Comments for Danger to Others Potential:  No data recorded  Are There Guns or Other Weapons in Your Home?  No data recorded   Types of Guns/Weapons: No data recorded   Are These Weapons Safely Secured?                              No data recorded   Who Could Verify You Are Able To Have These Secured:    No data recorded Do You Have any Outstanding Charges, Pending Court Dates, Parole/Probation?  No data recorded Contacted To Inform of Risk of Harm To Self or Others: No data recorded Location of Assessment: No data recorded Does Patient Present under Involuntary Commitment? Yes   IVC Papers Initial File Date: 08/08/19   [place] of Residence: [place]  Patient Currently Receiving the Following Services: Not Receiving Services   Determination of Need: No data recorded  Options For Referral: No data recorded  Alfredia Ferguson, LCAS

## 2019-08-09 ENCOUNTER — Inpatient Hospital Stay (HOSPITAL_COMMUNITY)
Admission: AD | Admit: 2019-08-09 | Discharge: 2019-08-14 | DRG: 885 | Disposition: A | Discharge status: Home / Self Care | Payer: Federal, State, Local not specified - Other | Attending: Psychiatry | Admitting: Psychiatry

## 2019-08-09 ENCOUNTER — Encounter (HOSPITAL_COMMUNITY): Payer: Self-pay | Admitting: Psychiatry

## 2019-08-09 ENCOUNTER — Other Ambulatory Visit: Payer: Self-pay

## 2019-08-09 DIAGNOSIS — Z7289 Other problems related to lifestyle: Secondary | ICD-10-CM

## 2019-08-09 DIAGNOSIS — R451 Restlessness and agitation: Secondary | ICD-10-CM | POA: Diagnosis not present

## 2019-08-09 DIAGNOSIS — Z79899 Other long term (current) drug therapy: Secondary | ICD-10-CM

## 2019-08-09 DIAGNOSIS — F1990 Other psychoactive substance use, unspecified, uncomplicated: Secondary | ICD-10-CM | POA: Diagnosis present

## 2019-08-09 DIAGNOSIS — F12259 Cannabis dependence with psychotic disorder, unspecified: Secondary | ICD-10-CM | POA: Diagnosis present

## 2019-08-09 DIAGNOSIS — F29 Unspecified psychosis not due to a substance or known physiological condition: Secondary | ICD-10-CM | POA: Diagnosis not present

## 2019-08-09 DIAGNOSIS — F17210 Nicotine dependence, cigarettes, uncomplicated: Secondary | ICD-10-CM | POA: Diagnosis present

## 2019-08-09 DIAGNOSIS — F28 Other psychotic disorder not due to a substance or known physiological condition: Principal | ICD-10-CM | POA: Diagnosis present

## 2019-08-09 DIAGNOSIS — F419 Anxiety disorder, unspecified: Secondary | ICD-10-CM | POA: Diagnosis present

## 2019-08-09 MED ORDER — ALUM & MAG HYDROXIDE-SIMETH 200-200-20 MG/5ML PO SUSP: 30.0000 mL | ORAL | Status: DC | PRN

## 2019-08-09 MED ORDER — ZIPRASIDONE MESYLATE 20 MG IM SOLR: 20.0000 mg | Freq: Two times a day (BID) | INTRAMUSCULAR | Status: DC | PRN

## 2019-08-09 MED ORDER — LORAZEPAM 1 MG PO TABS: 1.0000 mg | ORAL_TABLET | ORAL | Status: DC | PRN

## 2019-08-09 MED ORDER — HYDROXYZINE HCL 25 MG PO TABS: 25.0000 mg | ORAL_TABLET | Freq: Three times a day (TID) | ORAL | Status: DC | PRN

## 2019-08-09 MED ORDER — MAGNESIUM HYDROXIDE 400 MG/5ML PO SUSP: 30.0000 mL | Freq: Every day | ORAL | Status: DC | PRN

## 2019-08-09 MED ORDER — OLANZAPINE 5 MG PO TBDP: 5.0000 mg | ORAL_TABLET | Freq: Three times a day (TID) | ORAL | Status: DC | PRN

## 2019-08-09 MED ORDER — ZIPRASIDONE MESYLATE 20 MG IM SOLR: 10.0000 mg | INTRAMUSCULAR | Status: DC | PRN

## 2019-08-09 MED ORDER — ACETAMINOPHEN 325 MG PO TABS: 650.0000 mg | ORAL_TABLET | Freq: Four times a day (QID) | ORAL | Status: DC | PRN

## 2019-08-09 MED ORDER — OLANZAPINE 5 MG PO TABS
5.0000 mg | ORAL_TABLET | Freq: Every day | ORAL | Status: DC
  Administered 2019-08-09: 5 mg via ORAL
  Filled 2019-08-09 (×3): qty 1

## 2019-08-09 MED ORDER — LORAZEPAM 0.5 MG PO TABS
0.5000 mg | ORAL_TABLET | Freq: Four times a day (QID) | ORAL | Status: DC | PRN
  Filled 2019-08-09: qty 1
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
## 2019-08-09 NOTE — ED Notes (Signed)
Patient is pacing and cussing at staff.

## 2019-08-09 NOTE — ED Notes (Signed)
Report given Teacher, music at [facility]. Belongings given to officer to take with patient.

## 2019-08-09 NOTE — ED Notes (Signed)
Patient received breakfast tray 
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
## 2019-08-09 NOTE — Progress Notes (Signed)
  Admission DAR NOTE: Pt received from [facility], under IVC. Pt non compliant with admission process answering no, to all the questions stating, " I am not suppose to be here they brought me to the hospital and lied to me I am not suppose to be here". Denies SI/HI, Pt denied A/VH at present.   Emotional support and availability offered to Patient as needed. Skin assessment done and belongings searched per protocol. Items deemed contraband secured in locker. Unit orientation and routine discussed. Fluids and food offered, tolerated well. Q15 minutes safety checks initiated without self harm gestures.

## 2019-08-09 NOTE — [facility] Group Notes (Signed)
  [facility] LCSW Group Therapy Note  Date/Time:  08/09/2019 11:15AM-12:00PM  Type of Therapy and Topic:  Group Therapy:  Self-Care after Hospital Discharge  Participation Level:  None   Description of Group This process group involved patients discussing how they plan to take care of themselves in a better manner when they get home from the hospital.  The group started with patients listing one healthy and one unhealthy way they took care of themselves prior to hospitalization.  A discussion ensued about the differences in healthy and unhealthy coping skills.  Group members shared ideas about making changes when they return home so that they can stay well and in recovery.  The white board was used to list ideas so that patients can continue to see these ideas throughout the day.  Therapeutic Goals 1. Patient will identify and describe one healthy and one unhealthy coping technique used prior to hospitalization 2. Patient will participate in generating ideas about healthy self-care options when they return to the community 3. Patients will be supportive of one another and receive said support from others 4. Patient will identify one healthy self-care activity to add to his/her post-hospitalization life that can help in recovery  Summary of Patient Progress:  The patient arrived to group for the last 15 minutes, chose not to share, said she was new and would share the next time.   Therapeutic Modalities Brief Solution-Focused Therapy Motivational Interviewing Psychoeducation   Ambrose Mantle, LCSW 08/09/2019, 12:00pm

## 2019-08-09 NOTE — Progress Notes (Signed)
   08/09/19 1100  Psych Admission Type (Psych Patients Only)  Admission Status Voluntary  Psychosocial Assessment  Patient Complaints Agitation;Anger;Anxiety;Irritability;Suspiciousness  Eye Contact Fair  Facial Expression Anxious  Affect Angry;Irritable;Preoccupied  Speech Aggressive  Interaction Defensive  Motor Activity Slow  Appearance/Hygiene Unremarkable;In scrubs  Behavior Characteristics Unwilling to participate;Agressive verbally  Mood Suspicious;Apprehensive  Aggressive Behavior  Targets Other (Comment) (neighbour)  Environmental health practitioner of ideas  Content Blaming others  Delusions Paranoid  Perception WDL  Hallucination None reported or observed  Judgment Impaired  Confusion Moderate  Danger to Self  Current suicidal ideation? Denies  Danger to Others  Danger to Others None reported or observed

## 2019-08-09 NOTE — Progress Notes (Signed)
Pt accepted to [facility] Room 508-01 to the service of MD [facility] after 1000 hours.  Report may be called to [phone] when transportation has been arranged.

## 2019-08-10 DIAGNOSIS — F29 Unspecified psychosis not due to a substance or known physiological condition: Secondary | ICD-10-CM | POA: Diagnosis not present

## 2019-08-10 LAB — TSH: TSH: 0.583 u[IU]/mL (ref 0.350–4.500)

## 2019-08-10 LAB — HEMOGLOBIN A1C
Hgb A1c MFr Bld: 5.3 % (ref 4.8–5.6)
Mean Plasma Glucose: 105.41 mg/dL

## 2019-08-10 LAB — LIPID PANEL
Cholesterol: 178 mg/dL (ref 0–200)
HDL: 67 mg/dL (ref >40)
LDL Cholesterol: 101 mg/dL — ABNORMAL HIGH (ref 0–99)
Total CHOL/HDL Ratio: 2.7 RATIO
Triglycerides: 52 mg/dL (ref <150)
VLDL: 10 mg/dL (ref 0–40)

## 2019-08-10 MED ORDER — OLANZAPINE 10 MG PO TABS
10.0000 mg | ORAL_TABLET | Freq: Every day | ORAL | Status: DC
  Administered 2019-08-10 – 2019-08-13 (×4): 10 mg via ORAL
  Filled 2019-08-10: qty 1
  Filled 2019-08-10: qty 7
  Filled 2019-08-10 (×7): qty 1
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
## 2019-08-10 NOTE — Progress Notes (Signed)
   08/10/19 2100  COVID-19 Daily Checkoff  Have you had a fever (temp > 37.80C/100F)  in the past 24 hours?  No  If you have had runny nose, nasal congestion, sneezing in the past 24 hours, has it worsened? No  COVID-19 EXPOSURE  Have you traveled outside the state in the past 14 days? No  Have you been in contact with someone with a confirmed diagnosis of COVID-19 or PUI in the past 14 days without wearing appropriate PPE? No  Have you been living in the same home as a person with confirmed diagnosis of COVID-19 or a PUI (household contact)? No  Have you been diagnosed with COVID-19? No

## 2019-08-10 NOTE — Progress Notes (Signed)
  Pt is  female  of 29 y.o. , DOB 13-Mar-1990, MRN  [id number]  presents IVC with Delusion and [place] Behavior,  Hx of SI, poly substance abuse.    Patient Self Inventory Sheet  reports good appetite, energy normal, and fair sleeping pattern without mediaction. Pt rates  depression 0 out of 10, hopelessness 0 out of 10, and anxiety at 0 out of 10. Pt denies SI,  HI or AVH.  Pt reports LBM today, no physical problems or no pain.   Vitals signs WNL and being monitored. Pt states most important goals are to stay sober and keep my job.   Medication given as Rx, no adverse reaction observed, safety maintained with q15 minute checks.   Einar Crow. Melvyn Neth MSN, RN, [facility] [phone]

## 2019-08-10 NOTE — Progress Notes (Signed)
[place] NOVEL CORONAVIRUS (COVID-19) DAILY CHECK-OFF SYMPTOMS - answer yes or no to each - every day NO YES  Have you had a fever in the past 24 hours?  . Fever (Temp > 37.80C / 100F) X   Have you had any of these symptoms in the past 24 hours? . New Cough .  Sore Throat  .  Shortness of Breath .  Difficulty Breathing .  Unexplained Body Aches   X   Have you had any one of these symptoms in the past 24 hours not related to allergies?   . Runny Nose .  Nasal Congestion .  Sneezing   X   If you have had runny nose, nasal congestion, sneezing in the past 24 hours, has it worsened?  X   EXPOSURES - check yes or no X   Have you traveled outside the state in the past 14 days?  X   Have you been in contact with someone with a confirmed diagnosis of COVID-19 or PUI in the past 14 days without wearing appropriate PPE?  X   Have you been living in the same home as a person with confirmed diagnosis of COVID-19 or a PUI (household contact)?    X   Have you been diagnosed with COVID-19?    X              What to do next: Answered NO to all: Answered YES to anything:   Proceed with unit schedule Follow the [facility] Inpatient Flowsheet.   Bram Hottel K. Romie Keeble MSN, RN, [facility] [phone] 
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
## 2019-08-10 NOTE — Progress Notes (Signed)
Patient has been isolative to her room tonight only coming out for her medication. Writer attempted to engage her in conversation but she was short and snappy with her answer and retuned to her room. Safety maintained on unit with 15 min checks.

## 2019-08-10 NOTE — [facility] Group Notes (Signed)
[facility] LCSW Group Therapy Note  Date/Time:  08/10/2019  11:00AM-12:00PM  Type of Therapy and Topic:  Group Therapy:  Music and Mood  Participation Level:  Active   Description of Group: In this process group, members listened to a variety of genres of music and identified that different types of music evoke different responses.  Patients were encouraged to identify music that was soothing for them and music that was energizing for them.  Patients discussed how this knowledge can help with wellness and recovery in various ways including managing depression and anxiety as well as encouraging healthy sleep habits.    Therapeutic Goals: Patients will explore the impact of different varieties of music on mood Patients will verbalize the thoughts they have when listening to different types of music Patients will identify music that is soothing to them as well as music that is energizing to them Patients will discuss how to use this knowledge to assist in maintaining wellness and recovery Patients will explore the use of music as a coping skill  Summary of Patient Progress:  At the beginning of group, patient expressed that music can help in a lot of ways, like with fixing a headache.  She said string instrumentals would help with a migraine, but if it was a really bad migraine, heavy metal would help.  At the end of group, patient expressed that music is therapeutic for her and she had enjoyed it.    Therapeutic Modalities: Solution Focused Brief Therapy Activity   Ambrose Mantle, LCSW

## 2019-08-11 DIAGNOSIS — F29 Unspecified psychosis not due to a substance or known physiological condition: Secondary | ICD-10-CM | POA: Diagnosis not present

## 2019-08-11 NOTE — Progress Notes (Signed)
Recreation Therapy Notes  Date: 7.26.21 Time: 1000 Location: 500 Hall Dayroom  Group Topic: Wellness  Goal Area(s) Addresses:  Patient will define components of whole wellness. Patient will verbalize benefit of whole wellness.  Behavioral Response: Engaged  Intervention: Music  Activity: Exercise.  LRT led patients in a series of stretches to loosen up.  Patients then took turns leading group in various exercises.  Patients were told to get water and take breaks as needed.  Patients were also given the opportunity to request songs they wanted to hear when the exercises were done.  Education: Wellness, Building control surveyor.   Education Outcome: Acknowledges education/In group clarification offered/Needs additional education.   Clinical Observations/Feedback: Pt was social with peers and completed all the exercises.  Pt was bright and focused throughout activity.    Caroll Rancher, LRT/CTRS     Caroll Rancher A 08/11/2019 12:25 PM

## 2019-08-11 NOTE — Progress Notes (Signed)
[facility] MD Progress Note  08/11/2019 11:26 AM Brenda Hamilton  MRN:  [id number] Subjective:  Pt is a 29 year old female admitted to [facility] from [facility] ED, IVCed by her mother for delusions and hallucinations. Pt thinks demons are out to kill her. Patient reported concerns that her neighbors have been harassing her, breaking into her house, "leaving shadows behind". Pt has not slept in several days. Pt has a history of substance abuse. 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  Pt is seen and examined today. Pt is feeling good today. Pt states her mood is good. Pt states she slept well last night. Nursing notes indicate pt slept for 5.5 hours. Pt has flight of ideas. Pt states she has a lots of plans when she will get discharged. Pt states her roommate didn't pay the rent and now she is here so her landlord is going to evict her. Pt is anxious about her stuff. Pt still has fixed delusions that her neighbors are going to harm her. Pt states her appetite is good and she ate her meals. Currently, Pt denies any suicidal ideation, homicidal ideation and, visual and auditory hallucination. Pt denies decreased interest in activities, and energy. Pt denies racing thoughts and unusual powers. Pt denies hopelessness, worthlessness, guilt and anhedonia. Pt denies any side effects from medication.  Pt is calm, cooperative and alert and oriented x 4. Pt's speech is pressured and her thought process is tangential. Pt's mood is anxious and her affect is congruent. Pt is not agitated and not responding to internal stimuli. Pt is still delusional. No SI, HI and AVH  Principal Problem: <principal problem not specified> Diagnosis: Active Problems:   Psychosis (HCC)  Total Time spent with patient: 20 minutes  Past Psychiatric History: Pt has a history of substance abuse. 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 Pt again presented to ED in 2018 with hallucinations. Again, she was diagnosed with drug induced psychosis and treated with Zyprexa  Past Medical History:  Past Medical History:  Diagnosis Date  . Anxiety   . Anxiety disorder   . Cannabis dependence, continuous (HCC)   . 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 Cocaine abuse, continuous (HCC)     Past Surgical History:  Procedure Laterality Date  . NO PAST SURGERIES     Family History:  Family History  Problem Relation Age of Onset  . Diabetes Mother   . Other Neg Hx    Family Psychiatric  History: None Social History:  Social History   Substance and Sexual Activity  Alcohol Use Yes  . Alcohol/week: 0.0 standard drinks   Comment: occasional (once a month or less)     Social History   Substance and Sexual Activity  Drug Use Yes  . Types: Marijuana, MDMA (Ecstacy), Cocaine   Comment: Marijuana one week ago    Social History   Socioeconomic History  . Marital status: Single    Spouse name: Not on file  . Number of children: Not on file  . Years of education: Not on file  . Highest education level: Not on file  Occupational History  . Not on file  Tobacco Use  . Smoking status: Current Some Day Smoker    Packs/day: 0.25  Years: 4.00    Pack years: 1.00    Types: Cigarettes  . Smokeless tobacco: Never Used  Substance and Sexual Activity  . Alcohol use: Yes    Alcohol/week: 0.0 standard drinks    Comment: occasional (once a month or less)  . Drug use: Yes    Types: Marijuana, MDMA (Ecstacy), Cocaine    Comment: Marijuana one week ago  . Sexual activity: Yes    Birth control/protection: Condom  Other Topics Concern  . Not on file  Social History Narrative  . Not on file   Social Determinants of Health   Financial Resource Strain:    . Difficulty of Paying Living Expenses:   Food Insecurity:   . Worried About Programme researcher, broadcasting/film/video in the Last Year:   . Barista in the Last Year:   Transportation Needs:   . Freight forwarder (Medical):   Marland Kitchen Lack of Transportation (Non-Medical):   Physical Activity:   . Days of Exercise per Week:   . Minutes of Exercise per Session:   Stress:   . Feeling of Stress :   Social Connections:   . Frequency of Communication with Friends and Family:   . Frequency of Social Gatherings with Friends and Family:   . Attends Religious Services:   . Active Member of Clubs or Organizations:   . Attends Banker Meetings:   Marland Kitchen Marital Status:    Additional Social History:                         Sleep: Good  Appetite:  Good  Current Medications: Current Facility-Administered Medications  Medication Dose Route Frequency Provider Last Rate Last Admin  . acetaminophen (TYLENOL) tablet 650 mg  650 mg Oral Q6H PRN Jackelyn Poling, NP      . alum & mag hydroxide-simeth (MAALOX/MYLANTA) 200-200-20 MG/5ML suspension 30 mL  30 mL Oral Q4H PRN Nira Conn A, NP      . LORazepam (ATIVAN) tablet 0.5 mg  0.5 mg Oral Q6H PRN Cobos, Rockey Situ, MD      . OLANZapine zydis (ZYPREXA) disintegrating tablet 5 mg  5 mg Oral Q8H PRN Cobos, Rockey Situ, MD       And  . LORazepam (ATIVAN) tablet 1 mg  1 mg Oral PRN Cobos, Rockey Situ, MD       And  . ziprasidone (GEODON) injection 10 mg  10 mg Intramuscular PRN Cobos, Rockey Situ, MD      . magnesium hydroxide (MILK OF MAGNESIA) suspension 30 mL  30 mL Oral Daily PRN Jackelyn Poling, NP      . OLANZapine (ZYPREXA) tablet 10 mg  10 mg Oral QHS Cobos, Rockey Situ, MD   10 mg at 08/10/19 2054    Lab Results:  Results for orders placed or performed during the hospital encounter of 08/09/19 (from the past 48 hour(s))  Hemoglobin A1c     Status: None   Collection Time: 08/10/19  7:36 AM  Result Value Ref Range   Hgb A1c MFr Bld 5.3 4.8  - 5.6 %    Comment: (NOTE) Pre diabetes:          5.7%-6.4%  Diabetes:              >6.4%  Glycemic control for   <7.0% adults with diabetes    Mean Plasma Glucose 105.41 mg/dL    Comment: Performed at [facility], [address] [place], [place] [address]  Lipid panel     Status: Abnormal   Collection Time: 08/10/19  7:36 AM  Result Value Ref Range   Cholesterol 178 0 - 200 mg/dL   Triglycerides 52 <854 mg/dL   HDL 67 >62 mg/dL   Total CHOL/HDL Ratio 2.7 RATIO   VLDL 10 0 - 40 mg/dL   LDL Cholesterol 703 (H) 0 - 99 mg/dL    Comment:        Total Cholesterol/HDL:CHD Risk Coronary Heart Disease Risk Table                     Men   Women  1/2 Average Risk   3.4   3.3  Average Risk       5.0   4.4  2 X Average Risk   9.6   7.1  3 X Average Risk  23.4   11.0        Use the calculated Patient Ratio above and the CHD Risk Table to determine the patient's CHD Risk.        ATP III CLASSIFICATION (LDL):  <100     mg/dL   Optimal  500-938  mg/dL   Near or Above                    Optimal  130-159  mg/dL   Borderline  182-993  mg/dL   High  >716     mg/dL   Very High Performed at [facility], [address] [place], [place] [address]   TSH     Status: None   Collection Time: 08/10/19  7:36 AM  Result Value Ref Range   TSH 0.583 0.350 - 4.500 uIU/mL    Comment: Performed by a 3rd Generation assay with a functional sensitivity of <=0.01 uIU/mL. Performed at [facility], [address], [place], [place] [address]     Blood Alcohol level:  Lab Results  Component Value Date   ETH <10 08/07/2019   ETH <5 04/15/2016    Metabolic Disorder Labs: Lab Results  Component Value Date   HGBA1C 5.3 08/10/2019   MPG 105.41 08/10/2019   MPG 111 03/11/2015   Lab Results  Component Value Date   PROLACTIN 93.5 (H) 07/01/2015   Lab Results  Component Value Date   CHOL 178 08/10/2019   TRIG 52 08/10/2019   HDL 67 08/10/2019    CHOLHDL 2.7 08/10/2019   VLDL 10 08/10/2019   LDLCALC 101 (H) 08/10/2019   LDLCALC 79 06/30/2015    Physical Findings: AIMS:  , ,  ,  ,    CIWA:    COWS:     Musculoskeletal: Strength & Muscle Tone: within normal limits Gait & Station: normal Patient leans: N/A  Psychiatric Specialty Exam: Physical Exam  Review of Systems  Blood pressure (!) 132/87, pulse 86, temperature 98.3 F (36.8 C), temperature source Oral, resp. rate 18, height 5\' 4"  (1.626 m), weight 77.1 kg, last menstrual period 07/24/2019, SpO2 100 %.Body mass index is 29.18 kg/m.  General Appearance: Fairly Groomed  Eye Contact:  Good  Speech:  Pressured  Volume:  Increased  Mood:  Anxious and Euphoric  Affect:  Labile  Thought Process:  Disorganized and Descriptions of Associations: Tangential  Orientation:  Full (Time, Place, and Person)  Thought Content:  Delusions  Suicidal Thoughts:  No  Homicidal Thoughts:  No  Memory:  Immediate;   Good Recent;  Good Remote;   Fair  Judgement:  Impaired  Insight:  Lacking  Psychomotor Activity:  Normal  Concentration:  Concentration: Fair and Attention Span: Fair  Recall:  Good  Fund of Knowledge:  Good  Language:  Good  Akathisia:  No  Handed:  Ambidextrous  AIMS (if indicated):     Assets:  Communication Skills Desire for Improvement  ADL's:  Intact  Cognition:  WNL  Sleep:  Number of Hours: 5.5     Treatment Plan Summary: Pt was admitted with above psychiatric history. Pt is seen and examined today. Pt is feeling good today. Pt states her mood is good. Pt states she slept well last night. Nursing notes indicate pt slept for 5.5 hours. Pt has flight of ideas. Pt states she has a lots of plans when she will get discharged. Pt states her roommate didn't pay the rent and now she is here so her landlord is going to evict her. Pt is anxious about her stuff. Pt still has fixed delusions that her neighbors are going to harm her. Pt states her appetite is good and  she ate her meals. Currently, Pt denies any suicidal ideation, homicidal ideation and, visual and auditory hallucination. Pt denies decreased interest in activities, and energy. Pt denies racing thoughts and unusual powers. Pt denies hopelessness, worthlessness, guilt and anhedonia. Pt denies any side effects from medication.  Pt is calm, cooperative and alert and oriented x 4. Pt's speech is pressured and her thought process is tangential. Pt's mood is anxious and her affect is congruent. Pt is still delusional. Pt is not agitated and not responding to internal stimuli. P NO SI, HI and AVH New Labs- Mean Plasma Glucose- 105, Total Cholesterol- 178, HDL- 67, LDL- 101, Triglycerides 52, VLDL- 10 HbA1c- 5.3, TSH- 0.583 BP- 132/2987mmHg, PR- 86/min Zyprexa was increased yesterday to 10 mg nightly. Plan-  Daily contact with patient to assess and evaluate symptoms and progress in treatment  - Continue Agitation protocol if needed. -Continue Zyprexa 10 mg QHS for psychosis. -Continue Ativan 0.5 mg Q 6H PRN for Anxiety. -Continue Zyprexa 5 mg Q 8H PRN. -Continue Lorazepam 1 mg PRN for Agitation and Anxiety. -Continue Geodon 10 mg IM PRN for Agitation.   Karsten RoVandana  Azavier Creson, MD 08/11/2019, 11:26 AM

## 2019-08-11 NOTE — Progress Notes (Signed)
   08/11/19 2000  Psych Admission Type (Psych Patients Only)  Admission Status Voluntary  Psychosocial Assessment  Patient Complaints None  Eye Contact Fair  Facial Expression Blank  Affect Blunted  Speech Logical/coherent  Interaction Guarded;No initiation;Minimal  Motor Activity Slow  Appearance/Hygiene Unremarkable  Behavior Characteristics Cooperative  Mood Pleasant;Suspicious  Thought Process  Coherency Circumstantial  Content WDL  Delusions None reported or observed  Perception WDL  Hallucination None reported or observed  Judgment Impaired  Confusion UTA  Danger to Self  Current suicidal ideation? Denies  Danger to Others  Danger to Others None reported or observed

## 2019-08-11 NOTE — [facility] Suicide Risk Assessment (Signed)
[facility] INPATIENT:  Family/Significant Other Suicide Prevention Education  Suicide Prevention Education: Education Completed; Mother, Brenda Hamilton [phone]) has been identified by the patient as the family member/significant other with whom the patient will be residing, and identified as the person(s) who will aid the patient in the event of a mental health crisis (suicidal ideations/suicide attempt).  With written consent from the patient, the family member/significant other has been provided the following suicide prevention education, prior to the and/or following the discharge of the patient.  The suicide prevention education provided includes the following:  Suicide risk factors  Suicide prevention and interventions  National Suicide Hotline telephone number  [facility] assessment telephone number  [facility] Emergency Assistance 911  [facility] and/or Residential Mobile Crisis Unit telephone number   Request made of family/significant other to:  Remove weapons (e.g., guns, rifles, knives), all items previously/currently identified as safety concern.    Remove drugs/medications (over-the-counter, prescriptions, illicit drugs), all items previously/currently identified as a safety concern.   The family member/significant other verbalizes understanding of the suicide prevention education information provided.  The family member/significant other agrees to remove the items of safety concern listed above.  CSW spoke with this patients mother who reported that she believed this episode of psychosis was drug induced and believes her daughter may have smoked a synthetic THC. Pt's mother stated that she went three days with no sleep and became paranoid, delusional, and began having visual hallucinations. Mother stated that she wants this patient to follow up with outside providers for therapy and medication management.    Brenda Hamilton MSW, LCSW Clincal Social  Worker  [facility]

## 2019-08-11 NOTE — Tx Team (Cosign Needed)
Interdisciplinary Treatment and Diagnostic Plan Update  08/11/2019 Time of Session: 11:05am Brenda Hamilton MRN: [id number]  Principal Diagnosis: <principal problem not specified>  Secondary Diagnoses: Active Problems:   Psychosis ([name])   Current Medications:  Current Facility-Administered Medications  Medication Dose Route Frequency Provider Last Rate Last Admin  . acetaminophen (TYLENOL) tablet 650 mg  650 mg Oral Q6H PRN Rozetta Nunnery, NP      . alum & mag hydroxide-simeth (MAALOX/MYLANTA) 200-200-20 MG/5ML suspension 30 mL  30 mL Oral Q4H PRN Lindon Romp A, NP      . LORazepam (ATIVAN) tablet 0.5 mg  0.5 mg Oral Q6H PRN Cobos, Myer Peer, MD      . OLANZapine zydis (ZYPREXA) disintegrating tablet 5 mg  5 mg Oral Q8H PRN Cobos, Myer Peer, MD       And  . LORazepam (ATIVAN) tablet 1 mg  1 mg Oral PRN Cobos, Myer Peer, MD       And  . ziprasidone (GEODON) injection 10 mg  10 mg Intramuscular PRN Cobos, Fernando A, MD      . magnesium hydroxide (MILK OF MAGNESIA) suspension 30 mL  30 mL Oral Daily PRN Lindon Romp A, NP      . OLANZapine (ZYPREXA) tablet 10 mg  10 mg Oral QHS Cobos, Myer Peer, MD   10 mg at 08/10/19 2054   PTA Medications: Medications Prior to Admission  Medication Sig Dispense Refill Last Dose  . OLANZapine (ZYPREXA) 15 MG tablet Take 1 tablet (15 mg total) by mouth at bedtime. (Patient not taking: Reported on 04/15/2016) 30 tablet 0     Patient Stressors: Other: psychosis  Patient Strengths: Average or above average intelligence Communication skills Physical Health Supportive family/friends  Treatment Modalities: Medication Management, Group therapy, Case management,  1 to 1 session with clinician, Psychoeducation, Recreational therapy.   Physician Treatment Plan for Primary Diagnosis: <principal problem not specified> Long Term Goal(s): Improvement in symptoms so as ready for discharge Improvement in symptoms so as ready for discharge   Short Term  Goals: Ability to identify changes in lifestyle to reduce recurrence of condition will improve Ability to demonstrate self-control will improve Ability to maintain clinical measurements within normal limits will improve Compliance with prescribed medications will improve Ability to identify triggers associated with substance abuse/mental health issues will improve Ability to identify changes in lifestyle to reduce recurrence of condition will improve Ability to maintain clinical measurements within normal limits will improve Compliance with prescribed medications will improve Ability to identify triggers associated with substance abuse/mental health issues will improve  Medication Management: Evaluate patient's response, side effects, and tolerance of medication regimen.  Therapeutic Interventions: 1 to 1 sessions, Unit Group sessions and Medication administration.  Evaluation of Outcomes: Not Met  Physician Treatment Plan for Secondary Diagnosis: Active Problems:   Psychosis ([name])  Long Term Goal(s): Improvement in symptoms so as ready for discharge Improvement in symptoms so as ready for discharge   Short Term Goals: Ability to identify changes in lifestyle to reduce recurrence of condition will improve Ability to demonstrate self-control will improve Ability to maintain clinical measurements within normal limits will improve Compliance with prescribed medications will improve Ability to identify triggers associated with substance abuse/mental health issues will improve Ability to identify changes in lifestyle to reduce recurrence of condition will improve Ability to maintain clinical measurements within normal limits will improve Compliance with prescribed medications will improve Ability to identify triggers associated with substance abuse/mental health issues will improve  Medication Management: Evaluate patient's response, side effects, and tolerance of medication  regimen.  Therapeutic Interventions: 1 to 1 sessions, Unit Group sessions and Medication administration.  Evaluation of Outcomes: Not Met   RN Treatment Plan for Primary Diagnosis: <principal problem not specified> Long Term Goal(s): Knowledge of disease and therapeutic regimen to maintain health will improve  Short Term Goals: Ability to remain free from injury will improve, Ability to verbalize frustration and anger appropriately will improve, Ability to identify and develop effective coping behaviors will improve and Compliance with prescribed medications will improve  Medication Management: RN will administer medications as ordered by provider, will assess and evaluate patient's response and provide education to patient for prescribed medication. RN will report any adverse and/or side effects to prescribing provider.  Therapeutic Interventions: 1 on 1 counseling sessions, Psychoeducation, Medication administration, Evaluate responses to treatment, Monitor vital signs and CBGs as ordered, Perform/monitor CIWA, COWS, AIMS and Fall Risk screenings as ordered, Perform wound care treatments as ordered.  Evaluation of Outcomes: Not Met   LCSW Treatment Plan for Primary Diagnosis: <principal problem not specified> Long Term Goal(s): Safe transition to appropriate next level of care at discharge, Engage patient in therapeutic group addressing interpersonal concerns.  Short Term Goals: Engage patient in aftercare planning with referrals and resources, Increase social support, Identify triggers associated with mental health/substance abuse issues and Increase skills for wellness and recovery  Therapeutic Interventions: Assess for all discharge needs, 1 to 1 time with Social worker, Explore available resources and support systems, Assess for adequacy in community support network, Educate family and significant other(s) on suicide prevention, Complete Psychosocial Assessment, Interpersonal group  therapy.  Evaluation of Outcomes: Not Met   Progress in Treatment: Attending groups: Yes. Participating in groups: Yes. Taking medication as prescribed: Yes. Toleration medication: Yes. Family/Significant other contact made: No, will contact:  mother. Patient understands diagnosis: Yes. Discussing patient identified problems/goals with staff: Yes. Medical problems stabilized or resolved: Yes. Denies suicidal/homicidal ideation: Yes. Issues/concerns per patient self-inventory: No.   New problem(s) identified: No, Describe:  none.  New Short Term/Long Term Goal(s):  medication stabilization, elimination of SI thoughts, development of comprehensive mental wellness plan.   Patient Goals:  "Time to reflect, to communicate better"  Discharge Plan or Barriers: Patient recently admitted. CSW will continue to follow and assess for appropriate referrals and possible discharge planning.   Reason for Continuation of Hospitalization: Anxiety Delusions  Medication stabilization  Estimated Length of Stay: 3-5 days   Attendees: Patient: Brenda Hamilton 08/11/2019  Physician: Dr. Parke Poisson 08/11/2019  Nursing:  08/11/2019   RN Care Manager: 08/11/2019  Social Worker: Darletta Moll, Peg[place] 08/11/2019   Recreational Therapist:  08/11/2019   Other:  08/11/2019   Other:  08/11/2019   Other: 08/11/2019      Scribe for Treatment Team: Vassie Moselle, LCSW 08/11/2019 11:39 AM
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
## 2019-08-11 NOTE — [facility] Suicide Risk Assessment (Signed)
[facility] INPATIENT:  Family/Significant Other Suicide Prevention Education  Suicide Prevention Education:  Contact Attempts: Mother, Maija Biggers has been identified by the patient as the family member/significant other with whom the patient will be residing, and identified as the person(s) who will aid the patient in the event of a mental health crisis.  With written consent from the patient, two attempts were made to provide suicide prevention education, prior to and/or following the patient's discharge.  We were unsuccessful in providing suicide prevention education.  A suicide education pamphlet was given to the patient to share with family/significant other.   Date and time of first attempt: 08/11/2019 10:42am CSW left a HIPAA compliant voicemail for [employer].    [name] MSW, LCSW Clincal Social Worker  [facility]

## 2019-08-12 DIAGNOSIS — F29 Unspecified psychosis not due to a substance or known physiological condition: Secondary | ICD-10-CM | POA: Diagnosis not present
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
## 2019-08-12 NOTE — Progress Notes (Signed)
   08/12/19 2055  Psych Admission Type (Psych Patients Only)  Admission Status Involuntary  Psychosocial Assessment  Patient Complaints None  Eye Contact Fair  Facial Expression Blank  Affect Blunted  Speech Logical/coherent  Interaction Guarded;No initiation;Minimal  Motor Activity Slow  Appearance/Hygiene Unremarkable  Behavior Characteristics Cooperative  Mood Pleasant;Suspicious  Thought Process  Coherency Circumstantial  Content WDL  Delusions None reported or observed  Perception WDL  Hallucination None reported or observed  Judgment Impaired  Confusion None  Danger to Self  Current suicidal ideation? Denies  Danger to Others  Danger to Others None reported or observed

## 2019-08-13 DIAGNOSIS — F29 Unspecified psychosis not due to a substance or known physiological condition: Secondary | ICD-10-CM | POA: Diagnosis not present

## 2019-08-13 NOTE — Progress Notes (Signed)
[facility] MD Progress Note  08/13/2019 9:54 AM Brenda Hamilton  MRN:  [id number] Subjective:   Pt is a 29 year old female admitted to [facility] from [facility] ED, IVCed by her mother for delusions and hallucinations. Pt thinks demons are out to kill her. Patient reported concerns that her neighbors have been harassing her, breaking into her house, "leaving shadows behind". Pt has not slept in several days. Pt has a history of substance abuse. 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  Pt is seen and examined today. Pt is feeling good today and she rates her mood as 10/10. Pt states she has been sleeping well. Nursing notes indicate that pt slept for 6.25 hours last night. Pt states her appetite is also good and she has been eating all her meals. Pt states she doesn't experience visual hallucinations (shadows) any more. Pt states her goals will be to get better and go to her job site and fill up paperwork, collect her mail and get a covid vaccine. Pt states she would be compliant with her medication and stay away from Marijuana. Pt still thinks that her neighbours are bad and they are jealous of her as she renewed her lease. Pt denies any side effects from medication. Currently,Pt denies any suicidal ideation, homicidal ideation and, visual and auditory hallucination. Pt denies decreased interest in activities, and energy. Pt denies racing thoughts and unusual powers. Pt denies hopelessness, worthlessness, guilt and anhedonia. Her mother was called and informed about her discharge planning for tomorrow. She agreed with the plan. Pt is calm, cooperative and alert and oriented x 4. Pt's speech is normal and her thought process is linear. Pt's mood is anxious and her affect is congruent. Pt is not agitated and not responding to internal stimuli. Pt is still delusional. No SI, HI and AVH. Principal Problem:  <principal problem not specified> Diagnosis: Active Problems:   Psychosis (HCC)  Total Time spent with patient: 20 minutes  Past Psychiatric History: Pt has a history of substance abuse. 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 Pt again presented to ED in 2018 with hallucinations. Again, she was diagnosed with drug induced psychosis and treated with Zyprexa  Past Medical History:  Past Medical History:  Diagnosis Date  . Anxiety   . Anxiety disorder   . Cannabis dependence, continuous (HCC)   . 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) Feb 2017  . Cocaine abuse, continuous (HCC)     Past Surgical History:  Procedure Laterality Date  . NO PAST SURGERIES     Family History:  Family History  Problem Relation Age of Onset  . Diabetes Mother   . Other Neg Hx    Family Psychiatric  History: None Social History:  Social History   Substance and Sexual Activity  Alcohol Use Yes  . Alcohol/week: 0.0 standard drinks   Comment: occasional (once a month or less)     Social History   Substance and Sexual Activity  Drug Use Yes  . Types: Marijuana, MDMA (Ecstacy), Cocaine   Comment: Marijuana one week ago    Social History   Socioeconomic History  . Marital status: Single    Spouse name: Not on file  . Number of children: Not on file  . Years of education: Not on  file  . Highest education level: Not on file  Occupational History  . Not on file  Tobacco Use  . Smoking status: Current Some Day Smoker    Packs/day: 0.25    Years: 4.00    Pack years: 1.00    Types: Cigarettes  . Smokeless tobacco: Never Used  Substance and Sexual Activity  . Alcohol use: Yes    Alcohol/week: 0.0 standard drinks    Comment: occasional (once a month or less)  . Drug use: Yes    Types: Marijuana, MDMA (Ecstacy), Cocaine    Comment:  Marijuana one week ago  . Sexual activity: Yes    Birth control/protection: Condom  Other Topics Concern  . Not on file  Social History Narrative  . Not on file   Social Determinants of Health   Financial Resource Strain:   . Difficulty of Paying Living Expenses:   Food Insecurity:   . Worried About Programme researcher, broadcasting/film/video in the Last Year:   . Barista in the Last Year:   Transportation Needs:   . Freight forwarder (Medical):   Marland Kitchen Lack of Transportation (Non-Medical):   Physical Activity:   . Days of Exercise per Week:   . Minutes of Exercise per Session:   Stress:   . Feeling of Stress :   Social Connections:   . Frequency of Communication with Friends and Family:   . Frequency of Social Gatherings with Friends and Family:   . Attends Religious Services:   . Active Member of Clubs or Organizations:   . Attends Banker Meetings:   Marland Kitchen Marital Status:    Additional Social History:                         Sleep: Good  Appetite:  Good  Current Medications: Current Facility-Administered Medications  Medication Dose Route Frequency Provider Last Rate Last Admin  . acetaminophen (TYLENOL) tablet 650 mg  650 mg Oral Q6H PRN Jackelyn Poling, NP      . alum & mag hydroxide-simeth (MAALOX/MYLANTA) 200-200-20 MG/5ML suspension 30 mL  30 mL Oral Q4H PRN Nira Conn A, NP      . LORazepam (ATIVAN) tablet 0.5 mg  0.5 mg Oral Q6H PRN Cobos, Rockey Situ, MD      . OLANZapine zydis (ZYPREXA) disintegrating tablet 5 mg  5 mg Oral Q8H PRN Cobos, Rockey Situ, MD       And  . LORazepam (ATIVAN) tablet 1 mg  1 mg Oral PRN Cobos, Rockey Situ, MD       And  . ziprasidone (GEODON) injection 10 mg  10 mg Intramuscular PRN Cobos, Fernando A, MD      . magnesium hydroxide (MILK OF MAGNESIA) suspension 30 mL  30 mL Oral Daily PRN Nira Conn A, NP      . OLANZapine (ZYPREXA) tablet 10 mg  10 mg Oral QHS Cobos, Rockey Situ, MD   10 mg at 08/12/19 2054    Lab Results:  No results found for this or any previous visit (from the past 48 hour(s)).  Blood Alcohol level:  Lab Results  Component Value Date   [facility] <10 08/07/2019   ETH <5 04/15/2016    Metabolic Disorder Labs: Lab Results  Component Value Date   HGBA1C 5.3 08/10/2019   MPG 105.41 08/10/2019   MPG 111 03/11/2015   Lab Results  Component Value Date   PROLACTIN 93.5 (H) 07/01/2015  Lab Results  Component Value Date   CHOL 178 08/10/2019   TRIG 52 08/10/2019   HDL 67 08/10/2019   CHOLHDL 2.7 08/10/2019   VLDL 10 08/10/2019   LDLCALC 101 (H) 08/10/2019   LDLCALC 79 06/30/2015    Physical Findings: AIMS:  , ,  ,  ,    CIWA:    COWS:     Musculoskeletal: Strength & Muscle Tone: within normal limits Gait & Station: normal Patient leans: N/A  Psychiatric Specialty Exam: Physical Exam  Review of Systems  Blood pressure 118/69, pulse 88, temperature 99.5 F (37.5 C), temperature source Oral, resp. rate 20, height 5\' 4"  (1.626 m), weight 77.1 kg, last menstrual period 07/24/2019, SpO2 100 %.Body mass index is 29.18 kg/m.  General Appearance: Fairly Groomed  Eye Contact:  Good  Speech:  Normal Rate  Volume:  Normal  Mood:  Euthymic  Affect:  Congruent  Thought Process:  Linear  Orientation:  Full (Time, Place, and Person)  Thought Content:  Delusions  Suicidal Thoughts:  No  Homicidal Thoughts:  No  Memory:  Immediate;   Fair Recent;   Fair Remote;   Fair  Judgement:  Impaired  Insight:  Fair  Psychomotor Activity:  Normal  Concentration:  Concentration: Good and Attention Span: Good  Recall:  Good  Fund of Knowledge:  Good  Language:  Good  Akathisia:  No  Handed:  Ambidextrous  AIMS (if indicated):     Assets:  Communication Skills Desire for Improvement  ADL's:  Intact  Cognition:  WNL  Sleep:  Number of Hours: 6.25     Treatment Plan Summary:Pt is seen and examined today. Pt is feeling good today and she rates her mood as 10/10. Pt states she has been  sleeping well. Nursing notes indicate that pt slept for 6.25 hours last night. Pt states her appetite is also good and she has been eating all her meals. Pt states she doesn't experience visual hallucinations (shadows) any more. Pt states her goals will be to get better and go to her job site and fill up paperwork, collect her mail and get a covid vaccine. Pt states she would be compliant with her medication and stay away from Marijuana. Pt still thinks that her neighbours are bad and they are jealous of her as she renewed her lease. Pt denies any side effects from medication. Currently,Pt denies any suicidal ideation, homicidal ideation and, visual and auditory hallucination. Pt denies decreased interest in activities, and energy. Pt denies racing thoughts and unusual powers. Pt denies hopelessness, worthlessness, guilt and anhedonia. Her mother was called and informed about her discharge planning for tomorrow. She agreed with the plan. Pt is calm, cooperative and alert and oriented x 4. Pt's speech is normal and her thought process is linear. Pt's mood is anxious and her affect is congruent. Pt is not agitated and not responding to internal stimuli. Pt is still delusional. No SI, HI and AVH. Plan-  Daily contact with patient to assess and evaluate symptoms and progress in treatment - Continue Agitation protocol if needed. -Continue Zyprexa 10 mg QHS for psychosis. -Continue Ativan 0.5 mg Q 6H PRN for Anxiety. -Continue Zyprexa 5 mg Q 8H PRN. -Continue Lorazepam 1 mg PRN for Agitation and Anxiety. -Continue Geodon 10 mg IM PRN for Agitation. -Discharge planning in progress.  09/24/2019, MD 08/13/2019, 9:54 AM

## 2019-08-13 NOTE — [facility] Group Notes (Signed)
The focus of this group is to help patients establish daily goals to achieve during treatment and discuss how the patient can incorporate goal setting into their daily lives to aide in recovery.  Pt's goal is to stay compliant in order to have a minimal stay.

## 2019-08-13 NOTE — Progress Notes (Signed)
   08/13/19 1000  Psych Admission Type (Psych Patients Only)  Admission Status Involuntary  Psychosocial Assessment  Patient Complaints Suspiciousness  Eye Contact Fair  Facial Expression Blank  Affect Blunted  Speech Logical/coherent  Interaction Guarded;No initiation;Minimal  Motor Activity Slow  Appearance/Hygiene Unremarkable  Behavior Characteristics Appropriate to situation  Mood Pleasant  Thought Process  Coherency Circumstantial  Content WDL  Delusions None reported or observed  Perception WDL  Hallucination None reported or observed  Judgment Impaired  Confusion None  Danger to Self  Current suicidal ideation? Denies  Danger to Others  Danger to Others None reported or observed

## 2019-08-13 NOTE — Progress Notes (Signed)
Recreation Therapy Notes  Date: 7.28.21 Time: 0955 Location: [address] Dayroom   Group Topic: Communication, Team Building, Problem Solving  Goal Area(s) Addresses:  Patient will effectively work with peer towards shared goal.  Patient will identify skills used to make activity successful.  Patient will identify how skills used during activity can be used to reach post d/c goals.   Behavioral Response: Engaged  Intervention: STEM Activity  Activity: Straw Bridge.  In groups, patients were given 15 straws and 2 feet of masking tape.  Using the supplies given, patients were to construct an elevated bridge that could hold a small puzzle box.  Education: Pharmacist, community, Discharge Planning   Education Outcome: Acknowledges education/In group clarification offered/Needs additional education.   Clinical Observations/Feedback:  Pt was engaged and bouncing ideas off of peers and receptive to the ideas peers shared doing the activity.  Pt was able to focus for the majority of group but as it went on, pt started talking to herself about Brenda Hamilton and other random thoughts.  Pt explained when working with your support system, "everyone has a part to play".       Caroll Rancher, LRT/CTRS     Lillia Abed, Ginger Leeth A 08/13/2019 10:58 AM

## 2019-08-13 NOTE — Progress Notes (Signed)
Pt stated she was doing better , pt visible some on the unit this evening.     08/13/19 2300  Psych Admission Type (Psych Patients Only)  Admission Status Involuntary  Psychosocial Assessment  Patient Complaints Anxiety  Eye Contact Fair  Facial Expression Blank  Affect Blunted  Speech Logical/coherent  Interaction Guarded;No initiation;Minimal  Motor Activity Slow  Appearance/Hygiene Unremarkable  Behavior Characteristics Appropriate to situation  Mood Pleasant  Thought Process  Coherency Circumstantial  Content WDL  Delusions None reported or observed  Perception WDL  Hallucination None reported or observed  Judgment Impaired  Confusion None  Danger to Self  Current suicidal ideation? Denies  Danger to Others  Danger to Others None reported or observed

## 2019-08-14 DIAGNOSIS — F29 Unspecified psychosis not due to a substance or known physiological condition: Secondary | ICD-10-CM | POA: Diagnosis not present

## 2019-08-14 MED ORDER — OLANZAPINE 10 MG PO TABS: 10.0000 mg | ORAL_TABLET | Freq: Every day | ORAL | 0 refills | Status: AC
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
## 2019-08-14 NOTE — Progress Notes (Signed)
Discharge note: Nursing discharge note: Patient discharged home per MD order.  Patient received all personal belongings from unit and locker.  Reviewed AVS/transition record with patient and she indicates understanding.  [facility]  Patient denies any thoughts of self harm. She left ambulatory with her ride.
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
## 2019-08-14 NOTE — Plan of Care (Signed)
Pt was able to focus on tasks with no prompting from LRT at completion of recreation therapy group sessions.    Caroll Rancher, LRT/CTRS

## 2019-08-14 NOTE — Progress Notes (Signed)
Recreation Therapy Notes  INPATIENT RECREATION TR PLAN  Patient Details Name: Brenda Hamilton MRN: [id number] DOB: 04/30/1990 Today's Date: 08/14/2019  Rec Therapy Plan Is patient appropriate for Therapeutic Recreation?: Yes Treatment times per week: about 3 days Estimated Length of Stay: 5-7 days TR Treatment/Interventions: Group participation (Comment)  Discharge Criteria Pt will be discharged from therapy if:: Discharged Treatment plan/goals/alternatives discussed and agreed upon by:: Patient/family  Discharge Summary Short term goals set: See patient care plan Short term goals met: Complete Progress toward goals comments: Groups attended Which groups?: Wellness, Self-esteem, Other (Comment) (Team building) Reason goals not met: None Therapeutic equipment acquired: N/A Reason patient discharged from therapy: Discharge from hospital Pt/family agrees with progress & goals achieved: Yes Date patient discharged from therapy: 08/14/19    Victorino Sparrow, LRT/CTRS  [name], [name] 08/14/2019, 12:01 PM

## 2019-08-14 NOTE — Progress Notes (Signed)
Recreation Therapy Notes  Date: 7.29.21 Time: 1000 Location: [address] Dayroom  Group Topic: Self-Esteem  Goal Area(s) Addresses:  Patient will successfully identify positive attributes about themselves.  Patient will successfully identify benefit of improved self-esteem.   Behavioral Response: Engaged  Intervention: Blank crest, markers, colored pencils  Activity: Crest of Arms.  Patients were given a blank crest divided into four parts.  Patients were to highlight something unique about themselves in each of the sections.  Education:  Self-Esteem, Building control surveyor.   Education Outcome: Acknowledges education/In group clarification offered/Needs additional education  Clinical Observations/Feedback: Pt was active and engaged.  Pt was able to focus on activity.  Pt highlighted being a citizen of the [place].  Pt also expressed being a woman and being able to "see it all".  Pt also stated she has accomplished eternal life.     Caroll Rancher, LRT/CTRS         Lillia Abed, Benz Vandenberghe A 08/14/2019 11:19 AM

## 2019-08-14 NOTE — Progress Notes (Signed)
  [facility] Adult Case Management Discharge Plan :  Will you be returning to the same living situation after discharge:  Yes,  home. At discharge, do you have transportation home?: Yes,  mother to pick up patient.  Do you have the ability to pay for your medications: No. Samples to be provided at discharge.  Release of information consent forms completed and in the chart;  Patient's signature needed at discharge.  Patient to Follow up at:  Follow-up Information    [facility]. Go on 08/22/2019.   Specialty: Urgent Care Why: You have an appointment for therapy on 08/22/19 at 2:00 pm. You also have an appointment for medication management on 09/15/19 at 9:00 am.  These appointments will be held virtually via MyChart. Contact information: [address] [place] [address] [phone]              Next level of care provider has access to [facility] Link:yes  Safety Planning and Suicide Prevention discussed: Yes,  with mother.     Has patient been referred to the Quitline?: Patient refused referral  Patient has been referred for addiction treatment: Pt. refused referral  Otelia Santee, LCSW 08/14/2019, 10:11 AM

## 2019-08-21 ENCOUNTER — Telehealth (HOSPITAL_COMMUNITY): Payer: Self-pay | Admitting: *Deleted
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
## 2019-08-22 ENCOUNTER — Ambulatory Visit (HOSPITAL_COMMUNITY): Payer: Federal, State, Local not specified - Other | Admitting: Clinical

## 2019-09-15 ENCOUNTER — Telehealth (HOSPITAL_COMMUNITY): Payer: Federal, State, Local not specified - Other | Admitting: Psychiatry

## 2020-06-15 ENCOUNTER — Other Ambulatory Visit: Payer: Self-pay

## 2020-06-15 ENCOUNTER — Emergency Department (HOSPITAL_COMMUNITY)
Admission: EM | Admit: 2020-06-15 | Discharge: 2020-06-15 | Disposition: A | Discharge status: Home / Self Care | Payer: Medicaid Other | Attending: Emergency Medicine | Admitting: Emergency Medicine

## 2020-06-15 ENCOUNTER — Encounter (HOSPITAL_COMMUNITY): Payer: Self-pay

## 2020-06-15 DIAGNOSIS — S161XXA Strain of muscle, fascia and tendon at neck level, initial encounter: Secondary | ICD-10-CM | POA: Insufficient documentation

## 2020-06-15 DIAGNOSIS — F17210 Nicotine dependence, cigarettes, uncomplicated: Secondary | ICD-10-CM | POA: Insufficient documentation

## 2020-06-15 DIAGNOSIS — R519 Headache, unspecified: Secondary | ICD-10-CM | POA: Insufficient documentation

## 2020-06-15 DIAGNOSIS — Y92410 Unspecified street and highway as the place of occurrence of the external cause: Secondary | ICD-10-CM | POA: Insufficient documentation

## 2020-06-15 MED ORDER — METHOCARBAMOL 500 MG PO TABS
1000.0000 mg | ORAL_TABLET | Freq: Four times a day (QID) | ORAL | 0 refills | Status: AC
Start: 2020-06-15 — End: ?

## 2020-06-15 MED ORDER — NAPROXEN 500 MG PO TABS: 500.0000 mg | ORAL_TABLET | Freq: Two times a day (BID) | ORAL | 0 refills | Status: AC
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
## 2023-01-31 ENCOUNTER — Emergency Department (HOSPITAL_COMMUNITY)
Admission: EM | Admit: 2023-01-31 | Discharge: 2023-01-31 | Discharge status: Against Medical Advice | Payer: Self-pay | Attending: Emergency Medicine | Admitting: Emergency Medicine

## 2023-01-31 ENCOUNTER — Other Ambulatory Visit: Payer: Self-pay

## 2023-01-31 ENCOUNTER — Encounter (HOSPITAL_COMMUNITY): Payer: Self-pay | Admitting: Emergency Medicine

## 2023-01-31 DIAGNOSIS — Z5321 Procedure and treatment not carried out due to patient leaving prior to being seen by health care provider: Secondary | ICD-10-CM | POA: Insufficient documentation

## 2023-01-31 DIAGNOSIS — N899 Noninflammatory disorder of vagina, unspecified: Secondary | ICD-10-CM | POA: Insufficient documentation

## 2023-01-31 NOTE — ED Provider Triage Note (Signed)
Emergency Medicine Provider Triage Evaluation Note  Brenda Hamilton , a 33 y.o. female  was evaluated in triage.  Pt complains of vaginal region at the introitus, worsening over the past 3 weeks, burning pain, new partner, no urinary symptoms, no pelvic pain  Review of Systems  Positive: Vaginal disorder Negative: Fever or pelvic pain  Physical Exam  Ht 5\' 4"  (1.626 m)   Wt 86.2 kg   LMP 01/08/2023   BMI 32.61 kg/m  Gen:   Awake, no distress   Resp:  Normal effort  MSK:   Moves extremities without difficulty  Other:    Medical Decision Making  Medically screening exam initiated at 2:32 PM.  Appropriate orders placed.  Brenda Hamilton was informed that the remainder of the evaluation will be completed by another provider, this initial triage assessment does not replace that evaluation, and the importance of remaining in the ED until their evaluation is complete. Asked that   Arthor Captain, PA-C 01/31/23 1437
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
## 2023-01-31 NOTE — ED Notes (Signed)
 Pt left without being seen.

## 2023-02-01 ENCOUNTER — Emergency Department (HOSPITAL_COMMUNITY)
Admission: EM | Admit: 2023-02-01 | Discharge: 2023-02-01 | Disposition: A | Discharge status: Home / Self Care | Payer: Self-pay | Attending: Emergency Medicine | Admitting: Emergency Medicine

## 2023-02-01 ENCOUNTER — Encounter (HOSPITAL_COMMUNITY): Payer: Self-pay | Admitting: *Deleted

## 2023-02-01 ENCOUNTER — Other Ambulatory Visit: Payer: Self-pay

## 2023-02-01 DIAGNOSIS — B3731 Acute candidiasis of vulva and vagina: Secondary | ICD-10-CM | POA: Insufficient documentation

## 2023-02-01 LAB — URINALYSIS, ROUTINE W REFLEX MICROSCOPIC
Bilirubin Urine: NEGATIVE
Glucose, UA: NEGATIVE mg/dL
Hgb urine dipstick: NEGATIVE
Ketones, ur: NEGATIVE mg/dL
Leukocytes,Ua: NEGATIVE
Nitrite: NEGATIVE
Protein, ur: NEGATIVE mg/dL
Specific Gravity, Urine: 1.029 (ref 1.005–1.030)
pH: 5 (ref 5.0–8.0)

## 2023-02-01 LAB — HIV ANTIBODY (ROUTINE TESTING W REFLEX): HIV Screen 4th Generation wRfx: NONREACTIVE

## 2023-02-01 LAB — WET PREP, GENITAL
Clue Cells Wet Prep HPF POC: NONE SEEN
Sperm: NONE SEEN
Trich, Wet Prep: NONE SEEN
WBC, Wet Prep HPF POC: >=10 — AB (ref <10)
Yeast Wet Prep HPF POC: NONE SEEN

## 2023-02-01 LAB — RPR: RPR Ser Ql: NONREACTIVE

## 2023-02-01 LAB — HCG, SERUM, QUALITATIVE: Preg, Serum: NEGATIVE

## 2023-02-01 MED ORDER — FLUCONAZOLE 200 MG PO TABS: 200.0000 mg | ORAL_TABLET | Freq: Every day | ORAL | 0 refills | Status: AC

## 2023-02-01 MED ORDER — FLUCONAZOLE 200 MG PO TABS
200.0000 mg | ORAL_TABLET | Freq: Once | ORAL | Status: AC
  Administered 2023-02-01: 200 mg via ORAL
  Filled 2023-02-01: qty 1

## 2023-02-01 MED ORDER — TERCONAZOLE 0.4 % VA CREA: 1.0000 | TOPICAL_CREAM | Freq: Every day | VAGINAL | 0 refills | Status: AC

## 2023-02-01 NOTE — ED Notes (Signed)
Pt checked in yesterday and eloped prior to labs being drawn.

## 2023-02-01 NOTE — ED Triage Notes (Signed)
Pt reporting a "vaginal rash, dry, burning" Vaginal discharge "looks like a yeast infection"

## 2023-02-01 NOTE — Discharge Instructions (Addendum)
We are treating you for a yeast infection today.  You will take 2 more pills over the next 2 days but then you will do the vaginal inserts for 7 days.  Avoid having sex until symptoms have completely resolved and that you have received your culture results back to ensure there is no evidence of gonorrhea or chlamydia.  You are negative for syphilis and HIV today.  No signs of genital herpes.  Also avoid any new soaps or lubricants as well as this could have caused the yeast infection.
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
## 2023-02-02 LAB — GC/CHLAMYDIA PROBE AMP (~~LOC~~) NOT AT ARMC
Chlamydia: NEGATIVE
Comment: NEGATIVE
Comment: NORMAL
Neisseria Gonorrhea: NEGATIVE

## 2023-03-02 ENCOUNTER — Encounter (HOSPITAL_COMMUNITY): Payer: Self-pay

## 2023-03-02 ENCOUNTER — Emergency Department (HOSPITAL_COMMUNITY)
Admission: EM | Admit: 2023-03-02 | Discharge: 2023-03-02 | Disposition: A | Discharge status: Home / Self Care | Payer: MEDICAID | Attending: Emergency Medicine | Admitting: Emergency Medicine

## 2023-03-02 ENCOUNTER — Emergency Department (HOSPITAL_COMMUNITY): Payer: MEDICAID

## 2023-03-02 ENCOUNTER — Other Ambulatory Visit: Payer: Self-pay

## 2023-03-02 DIAGNOSIS — M542 Cervicalgia: Secondary | ICD-10-CM | POA: Insufficient documentation

## 2023-03-02 DIAGNOSIS — F17210 Nicotine dependence, cigarettes, uncomplicated: Secondary | ICD-10-CM | POA: Insufficient documentation

## 2023-03-02 DIAGNOSIS — M79606 Pain in leg, unspecified: Secondary | ICD-10-CM | POA: Insufficient documentation

## 2023-03-02 DIAGNOSIS — R519 Headache, unspecified: Secondary | ICD-10-CM | POA: Insufficient documentation

## 2023-03-02 MED ORDER — NAPROXEN 500 MG PO TABS: 500.0000 mg | ORAL_TABLET | Freq: Two times a day (BID) | ORAL | 0 refills | Status: AC
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
## 2023-07-05 ENCOUNTER — Other Ambulatory Visit: Payer: Self-pay | Admitting: Obstetrics & Gynecology

## 2023-07-05 DIAGNOSIS — Z36 Encounter for antenatal screening for chromosomal anomalies: Secondary | ICD-10-CM

## 2023-07-05 DIAGNOSIS — O99210 Obesity complicating pregnancy, unspecified trimester: Secondary | ICD-10-CM | POA: Insufficient documentation

## 2023-07-06 ENCOUNTER — Ambulatory Visit (HOSPITAL_BASED_OUTPATIENT_CLINIC_OR_DEPARTMENT_OTHER)

## 2023-07-06 ENCOUNTER — Ambulatory Visit: Attending: Certified Nurse Midwife | Admitting: Obstetrics and Gynecology

## 2023-07-06 VITALS — BP 132/67 | HR 96

## 2023-07-06 DIAGNOSIS — F1290 Cannabis use, unspecified, uncomplicated: Secondary | ICD-10-CM

## 2023-07-06 DIAGNOSIS — O99212 Obesity complicating pregnancy, second trimester: Secondary | ICD-10-CM | POA: Diagnosis present

## 2023-07-06 DIAGNOSIS — O10912 Unspecified pre-existing hypertension complicating pregnancy, second trimester: Secondary | ICD-10-CM | POA: Insufficient documentation

## 2023-07-06 DIAGNOSIS — O99322 Drug use complicating pregnancy, second trimester: Secondary | ICD-10-CM

## 2023-07-06 DIAGNOSIS — O99342 Other mental disorders complicating pregnancy, second trimester: Secondary | ICD-10-CM | POA: Insufficient documentation

## 2023-07-06 DIAGNOSIS — E669 Obesity, unspecified: Secondary | ICD-10-CM | POA: Diagnosis not present

## 2023-07-06 DIAGNOSIS — Z36 Encounter for antenatal screening for chromosomal anomalies: Secondary | ICD-10-CM

## 2023-07-06 DIAGNOSIS — O99210 Obesity complicating pregnancy, unspecified trimester: Secondary | ICD-10-CM

## 2023-07-06 DIAGNOSIS — Z6835 Body mass index (BMI) 35.0-35.9, adult: Secondary | ICD-10-CM | POA: Diagnosis present

## 2023-07-06 DIAGNOSIS — Z3A21 21 weeks gestation of pregnancy: Secondary | ICD-10-CM

## 2023-07-06 NOTE — Progress Notes (Signed)
 Maternal-Fetal Medicine Consultation Name: Eknoor Novack MRN: [id number]  G2 P0010 at 21w 3d gestation.  Patient is here for fetal anatomy scan. Cell free fetal DNA screening results are pending.  Past psychiatric history include psychosis and substance use disorders.  Patient was taking olanzapine  (Zyprexa ) in the past but is not taking antipsychotic medications now.    She reports that she was told she does not have to take any medications.  Patient was also taking methocarbamol  (Robaxin ) for muscular pain. Patient reports she was using cocaine and marijuana but not in this pregnancy.  She reports no chronic medical conditions including diabetes or hypertension.  We performed fetal anatomy scan. No makers of aneuploidies or fetal structural defects are seen. Fetal biometry is consistent with her previously-established dates. Amniotic fluid is normal and good fetal activity is seen. Patient understands the limitations of ultrasound in detecting fetal anomalies.  As maternal obesity imposes limitations on the resolution of images, fetal anomalies may be missed.  Our concerns include: Medications (olanzepine) It is a psychotropic drug used in the treatment of schizophrenia and bipolar disorder.  No increased fetal congenital malformations were reported with this drug.  He can cause neonatal withdrawal syndrome and extraparametal symptoms.  However, if patient requires treatment it should be taken in pregnancy.  Benefit of treatment far outweighs potential adverse fetal and neonatal effects.  Cannabis Marijuana is the most-commonly used recreational drug in pregnancy. About 7% to 10% of pregnant women take cannabis. It has been used by some to treat anxiety, depression, nausea and vomiting. A small increase in preterm delivery and neonatal intensive care admissions. No increase in fetal congenital malformations were reported with cannabis. Fetal brain has THC receptor and long-term effects are  not known.   Concomitant use of tobacco or other drugs increase the risk of pregnancy adverse outcomes.   Some studies reported increase in stillbirth rates (absolute risk is very small) but that could be because of concomitant use of other recreational drugs or tobacco use. In consistent with ACOG recommendations, cannabis should be avoided in pregnancy.  Cocaine is associated with increased likelihood of maternal hypertension and placental abruption.  I counseled the patient that both these drugs are to be avoided in pregnancy.  Patient is moving to [place]  next week and will transfer her prenatal care there.  Recommendations - No follow-up appointments were made Consultation including face-to-face (more than 50%) counseling 30 minutes.
